# Patient Record
Sex: Female | Born: 1995 | Hispanic: Yes | Marital: Single | Smoking: Never smoker
Health system: Southern US, Community
[De-identification: ages and names within clinical notes are randomized; demographics above are authoritative.]

## PROBLEM LIST (undated history)

## (undated) DIAGNOSIS — Z789 Other specified health status: Secondary | ICD-10-CM

## (undated) DIAGNOSIS — J45909 Unspecified asthma, uncomplicated: Secondary | ICD-10-CM

## (undated) HISTORY — DX: Other specified health status: Z78.9

## (undated) HISTORY — PX: NO PAST SURGERIES: SHX2092

---

## 2016-06-05 NOTE — L&D Delivery Note (Signed)
Delivery Note At 10:49 PM a viable female was delivered via Vaginal, Spontaneous (Presentation: OA).  APGAR: 9, 9; weight pending.   Placenta status: delivered spontaneously, intact.  Cord: 3VC with the following complications: none.  Cord pH: n/a  Anesthesia:  n/a Episiotomy: None Lacerations: None Suture Repair: n/a Est. Blood Loss (mL): 350  Mom to postpartum.  Baby to Couplet care / Skin to Skin.  Called to see patient.  Mom pushed to deliver a viable female infant.  The head followed by shoulders, which delivered without difficulty, and the rest of the body.  No nuchal cord noted.  Baby to mom's chest.  Cord clamped and cut after > 1 min delay.  Cord blood obtained.  Placenta delivered spontaneously, intact, with a 3-vessel cord.  Small right sulcal tear hemostatic without repair.  All counts correct.  Hemostasis obtained with IV pitocin and fundal massage. EBL 350 mL.     Thomasene MohairStephen Janett Kamath, MD 05/06/2017, 11:07 PM

## 2016-12-05 LAB — OB RESULTS CONSOLE GC/CHLAMYDIA
Chlamydia: POSITIVE
GC PROBE AMP, GENITAL: NEGATIVE

## 2016-12-05 LAB — OB RESULTS CONSOLE RUBELLA ANTIBODY, IGM: RUBELLA: IMMUNE

## 2016-12-05 LAB — OB RESULTS CONSOLE HEPATITIS B SURFACE ANTIGEN: Hepatitis B Surface Ag: NEGATIVE

## 2016-12-05 LAB — OB RESULTS CONSOLE HIV ANTIBODY (ROUTINE TESTING): HIV: NONREACTIVE

## 2017-03-15 LAB — OB RESULTS CONSOLE GC/CHLAMYDIA
CHLAMYDIA, DNA PROBE: NEGATIVE
Gonorrhea: NEGATIVE

## 2017-05-01 LAB — OB RESULTS CONSOLE GBS: GBS: NEGATIVE

## 2017-05-06 ENCOUNTER — Inpatient Hospital Stay
Admission: EM | Admit: 2017-05-06 | Discharge: 2017-05-08 | DRG: 807 | Disposition: A | Discharge status: Home / Self Care | Payer: Medicaid - Out of State | Attending: Obstetrics and Gynecology | Admitting: Obstetrics and Gynecology

## 2017-05-06 ENCOUNTER — Encounter: Payer: Self-pay | Admitting: *Deleted

## 2017-05-06 ENCOUNTER — Other Ambulatory Visit: Payer: Self-pay

## 2017-05-06 DIAGNOSIS — Z3A37 37 weeks gestation of pregnancy: Secondary | ICD-10-CM

## 2017-05-06 DIAGNOSIS — Z3483 Encounter for supervision of other normal pregnancy, third trimester: Secondary | ICD-10-CM | POA: Diagnosis present

## 2017-05-06 HISTORY — DX: Unspecified asthma, uncomplicated: J45.909

## 2017-05-06 LAB — CHLAMYDIA/NGC RT PCR (ARMC ONLY)
Chlamydia Tr: NOT DETECTED
N GONORRHOEAE: NOT DETECTED

## 2017-05-06 LAB — CBC
HEMATOCRIT: 39.5 % (ref 35.0–47.0)
Hemoglobin: 13.8 g/dL (ref 12.0–16.0)
MCH: 33 pg (ref 26.0–34.0)
MCHC: 35 g/dL (ref 32.0–36.0)
MCV: 94.1 fL (ref 80.0–100.0)
Platelets: 186 10*3/uL (ref 150–440)
RBC: 4.2 MIL/uL (ref 3.80–5.20)
RDW: 12.6 % (ref 11.5–14.5)
WBC: 15.5 10*3/uL — AB (ref 3.6–11.0)

## 2017-05-06 LAB — TYPE AND SCREEN
ABO/RH(D): O POS
Antibody Screen: NEGATIVE

## 2017-05-06 MED ORDER — OXYTOCIN 40 UNITS IN LACTATED RINGERS INFUSION - SIMPLE MED
1.0000 m[IU]/min | INTRAVENOUS | Status: DC
  Administered 2017-05-06: 1 m[IU]/min via INTRAVENOUS

## 2017-05-06 MED ORDER — LACTATED RINGERS IV SOLN
INTRAVENOUS | Status: DC
  Administered 2017-05-06 (×2): via INTRAVENOUS

## 2017-05-06 MED ORDER — TERBUTALINE SULFATE 1 MG/ML IJ SOLN: 0.2500 mg | Freq: Once | INTRAMUSCULAR | Status: DC | PRN

## 2017-05-06 MED ORDER — OXYTOCIN 40 UNITS IN LACTATED RINGERS INFUSION - SIMPLE MED: 2.5000 [IU]/h | INTRAVENOUS | Status: DC

## 2017-05-06 MED ORDER — LIDOCAINE HCL (PF) 1 % IJ SOLN: 30.0000 mL | INTRAMUSCULAR | Status: DC | PRN

## 2017-05-06 MED ORDER — BUTORPHANOL TARTRATE 1 MG/ML IJ SOLN: 1.0000 mg | INTRAMUSCULAR | Status: DC | PRN

## 2017-05-06 MED ORDER — ACETAMINOPHEN 325 MG PO TABS: 650.0000 mg | ORAL_TABLET | ORAL | Status: DC | PRN

## 2017-05-06 MED ORDER — MISOPROSTOL 200 MCG PO TABS
ORAL_TABLET | ORAL | Status: AC
  Filled 2017-05-06: qty 4

## 2017-05-06 MED ORDER — LACTATED RINGERS IV SOLN: 500.0000 mL | INTRAVENOUS | Status: DC | PRN

## 2017-05-06 MED ORDER — AMMONIA AROMATIC IN INHA
RESPIRATORY_TRACT | Status: AC
  Filled 2017-05-06: qty 10

## 2017-05-06 MED ORDER — OXYTOCIN BOLUS FROM INFUSION
500.0000 mL | Freq: Once | INTRAVENOUS | Status: AC
  Administered 2017-05-06: 500 mL via INTRAVENOUS

## 2017-05-06 MED ORDER — OXYTOCIN 40 UNITS IN LACTATED RINGERS INFUSION - SIMPLE MED
INTRAVENOUS | Status: AC
  Administered 2017-05-06: 1 m[IU]/min via INTRAVENOUS
  Filled 2017-05-06: qty 1000

## 2017-05-06 MED ORDER — OXYTOCIN 10 UNIT/ML IJ SOLN
INTRAMUSCULAR | Status: AC
Start: 2017-05-06 — End: 2017-05-07
  Filled 2017-05-06: qty 2

## 2017-05-06 MED ORDER — ONDANSETRON HCL 4 MG/2ML IJ SOLN: 4.0000 mg | Freq: Four times a day (QID) | INTRAMUSCULAR | Status: DC | PRN

## 2017-05-06 MED ORDER — LIDOCAINE HCL (PF) 1 % IJ SOLN
INTRAMUSCULAR | Status: AC
Start: 2017-05-06 — End: 2017-05-07
  Filled 2017-05-06: qty 30

## 2017-05-06 NOTE — H&P (Signed)
OB History & Physical   History of Present Illness:  Chief Complaint: Contractions every 10 minutes since 11 am this morning. Water may have broken yesterday at 4 pm.  HPI:  Robin Hawkins is a 21 y.o. G1P0 female at 9225w4d dated by LMP agreed with 14 week u/s.  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 .    She reports contractions since 11 am this morning.   She reports a gush of fluid beginning at 4 pm yesterday with continued leaking since then.   She reports vaginal bleeding since this morning when wiping.   She reports fetal movement.    The patient is here visiting family from [place]. Her care provider is [facility]. Patient's first language is [place]. Patient interviews and instruction provided through interpreter.  Maternal Medical History:   Past Medical History:  Diagnosis Date  . Asthma     Past Surgical History:  Procedure Laterality Date  . NO PAST SURGERIES      No Known Allergies  Prior to Admission medications   Medication Sig Start Date End Date Taking? Authorizing Provider  Prenatal Vit-Fe Fumarate-FA (MULTIVITAMIN-PRENATAL) 27-0.8 MG TABS tablet Take 1 tablet by mouth daily at 12 noon.    [provider]    OB History  Gravida Para Term Preterm AB Living  1            SAB TAB Ectopic Multiple Live Births               # Outcome Date GA Lbr Len/2nd Weight Sex Delivery Anes PTL Lv  1 Current               Prenatal care site: [facility]  Social History: She  reports that  has never smoked. she has never used smokeless tobacco. She reports that she does not drink alcohol or use drugs.  Family History: no family history of genetic/birth defects She denies a family  history of gynecologic cancers  Review of Systems: Negative x 10 systems reviewed except as noted in the HPI.    Physical Exam:  Vital Signs: Ht 5\' 6"  (1.676 m)   Wt 154 lb (69.9 kg)   BMI 24.86 kg/m  Constitutional: Well nourished, well developed female in no acute distress.  HEENT: normal Skin: Warm and dry.  Cardiovascular: Regular rate and rhythm.   Extremity: no evidence of DVT  Respiratory: Clear to auscultation bilateral. Normal respiratory effort Abdomen: soft, nontender, nondistended, no abnormal masses, no epigastric pain and FHT present Back: no CVAT Neuro: DTRs 2+, Cranial nerves grossly intact Psych: Alert and Oriented x3. No memory deficits. Normal mood and affect.  MS: normal gait, normal bilateral lower extremity ROM/strength/stability.  Pelvic exam:  is not limited by body habitus EGBUS: within normal limits Vagina: within normal limits and with normal mucosa, watery blood pooling at the cervix by sterile speculum exam  Cervix: 1/70/-2 per RN exam Nitrazine equivocal Pooling + Ferning: only red blood cells seen   Pertinent Results:  Prenatal Labs: Blood type/Rh O positive  Antibody screen negative  Rubella Immune  Varicella Unknown    RPR Non-reactive  HBsAg negative  HIV negative  GC negative  Chlamydia Positive 7/3, Negative 10/11  Genetic screening MSAFP neg  1 hour  GTT 119  3 hour GTT NA  GBS negative on 11/27   Baseline FHR: 140 beats/min   Variability: moderate   Accelerations: 10x10 present   Decelerations: variable present Contractions: present frequency: every 2-3 min Overall assessment: Category II but overall reassuring  No ultrasound report available but records state ultrasound was normal  Assessment:  Robin Hawkins is a 21 y.o. G1P0 female at 2852w4d with probable rupture of membranes, frequent contractions.   Plan:  1. Admit to Labor & Delivery  2. CBC, T&S, Clrs, IVF 3. GBS negative.   4. Fetal well-being: overall  reassuring 5. Augment labor with pitocin 6. [facility]  Tresea MallJane Dhani Imel, [place] 05/06/2017 1:37 PM

## 2017-05-06 NOTE — Plan of Care (Signed)
Discussed plan of care with patient. Patient verbalized understanding.

## 2017-05-06 NOTE — Progress Notes (Signed)
Robin LungLiz Chavez, volunteer doula, at bedside to assist patient with laboring process.

## 2017-05-06 NOTE — Progress Notes (Signed)
Difficulty accurately assessing contractions while patient sitting up on birthing ball, unable to accurately correlate decelerations with ctxs. Toco adjusted. RN remains at bedside.

## 2017-05-06 NOTE — Discharge Summary (Signed)
OB Discharge Summary     Patient Name: Robin Hawkins DOB: 1996/03/07 MRN: [id number]  Date of admission: 05/06/2017 Delivering MD: Thomasene MohairStephen Jackson, MD  Date of Delivery: 05/06/2017  Date of discharge: 05/08/2017  Admitting diagnosis: 40 weeks contractions Intrauterine pregnancy: 7263w4d     Secondary diagnosis: None     Discharge diagnosis: Term Pregnancy Delivered                                                                                                Post partum procedures:None  Augmentation: Pitocin  Complications: None  Hospital course:  Onset of Labor With Vaginal Delivery     21 y.o. yo G1P0 at 663w4d was admitted in Active Labor on 05/06/2017. Patient had an uncomplicated labor course as follows:  Membrane Rupture Time/Date: 4:00 PM ,05/05/2017   Intrapartum Procedures: Episiotomy: None [1]                                         Lacerations:  None [1]  Patient had a delivery of a Viable infant. 05/06/2017  Information for the patient's newborn:  Robin Hawkins, Boy Karsten [[id number]][[id number]]  Delivery Method: Vag-Spont    Pateint had an uncomplicated postpartum course.  She is ambulating, tolerating a regular diet, passing flatus, and urinating well. Patient is discharged home in stable condition on 05/08/17.   Physical exam  Vitals:   05/07/17 1618 05/07/17 2021 05/07/17 2324 05/08/17 0736  BP: 110/64 106/73 105/63 (!) 101/59  Pulse: 85 74 78 78  Resp:  18 18 16   Temp: 97.9 F (36.6 C) 98.3 F (36.8 C) 98.1 F (36.7 C) (!) 97.5 F (36.4 C)  TempSrc: Oral Oral Oral Oral  SpO2: 100% 99% 99% 99%  Weight:      Height:       General: alert, cooperative and no distress Lochia: appropriate Uterine Fundus: firm Incision: N/A DVT Evaluation: No evidence of DVT seen on physical exam. No significant calf/ankle edema.  Labs: Lab Results  Component Value Date   WBC 21.9 (H) 05/07/2017   HGB 12.5 05/07/2017   HCT 36.3 05/07/2017   MCV 94.2 05/07/2017   PLT 155  05/07/2017    Discharge instruction: per After Visit Summary.  Medications:  Allergies as of 05/08/2017   No Known Allergies     Medication List    TAKE these medications   ibuprofen 600 MG tablet Commonly known as:  ADVIL,MOTRIN Take 1 tablet (600 mg total) by mouth every 6 (six) hours.   multivitamin-prenatal 27-0.8 MG Tabs tablet Take 1 tablet by mouth daily at 12 noon.       Diet: routine diet  Activity: Advance as tolerated. Pelvic rest for 6 weeks.   Outpatient follow up: Follow-up Information    Conard NovakJackson, Stephen D, MD Follow up in 6 week(s).   Specialty:  Obstetrics and Gynecology Why:  6 week postpartum visit or home OB/GYN Contact information: [address] [place] [id number] [phone]  Postpartum contraception: Undecided Rhogam Given postpartum: no Rubella vaccine given postpartum: no Varicella vaccine given postpartum: no TDaP given antepartum or postpartum: unknown  Newborn Data: Live born female  Birth Weight:  2860 grams APGAR: 9, 9  Newborn Delivery   Birth date/time:  05/06/2017 22:49:00 Delivery type:  Vaginal, Spontaneous    Baby Feeding: Breast  Disposition:home with mother  SIGNED: Adelene Idlerhristanna Schuman MD

## 2017-05-06 NOTE — OB Triage Note (Deleted)
Patient came in for observation for preterm labor evaluation. Patient reports contractions every four minutes that started today at 1700. Patient rates pain 6/10. Patient denies leaking of fluid, denies vaginal bleeding and spotting. Patient reports + FM at this time. Vital signs stable and patient afebrile. FHR baseline 115 with moderate variability with accelerations 15 x 15 and no decelerations. Husband at bedside. Will continue to monitor.    

## 2017-05-07 DIAGNOSIS — Z3A37 37 weeks gestation of pregnancy: Secondary | ICD-10-CM

## 2017-05-07 LAB — CBC
HEMATOCRIT: 36.3 % (ref 35.0–47.0)
Hemoglobin: 12.5 g/dL (ref 12.0–16.0)
MCH: 32.3 pg (ref 26.0–34.0)
MCHC: 34.3 g/dL (ref 32.0–36.0)
MCV: 94.2 fL (ref 80.0–100.0)
Platelets: 155 10*3/uL (ref 150–440)
RBC: 3.85 MIL/uL (ref 3.80–5.20)
RDW: 12.4 % (ref 11.5–14.5)
WBC: 21.9 10*3/uL — AB (ref 3.6–11.0)

## 2017-05-07 MED ORDER — SENNOSIDES-DOCUSATE SODIUM 8.6-50 MG PO TABS
2.0000 | ORAL_TABLET | ORAL | Status: DC
  Administered 2017-05-07 (×2): 2 via ORAL
  Filled 2017-05-07 (×2): qty 2

## 2017-05-07 MED ORDER — ONDANSETRON HCL 4 MG/2ML IJ SOLN: 4.0000 mg | INTRAMUSCULAR | Status: DC | PRN

## 2017-05-07 MED ORDER — COCONUT OIL OIL
1.0000 "application " | TOPICAL_OIL | Status: DC | PRN
  Administered 2017-05-07: 1 via TOPICAL
  Filled 2017-05-07: qty 120

## 2017-05-07 MED ORDER — IBUPROFEN 600 MG PO TABS
600.0000 mg | ORAL_TABLET | Freq: Four times a day (QID) | ORAL | Status: DC
  Administered 2017-05-07 – 2017-05-08 (×6): 600 mg via ORAL
  Filled 2017-05-07 (×6): qty 1

## 2017-05-07 MED ORDER — DIPHENHYDRAMINE HCL 25 MG PO CAPS: 25.0000 mg | ORAL_CAPSULE | Freq: Four times a day (QID) | ORAL | Status: DC | PRN

## 2017-05-07 MED ORDER — BENZOCAINE-MENTHOL 20-0.5 % EX AERO
1.0000 "application " | INHALATION_SPRAY | CUTANEOUS | Status: DC | PRN
  Administered 2017-05-07: 1 via TOPICAL
  Filled 2017-05-07: qty 56

## 2017-05-07 MED ORDER — WITCH HAZEL-GLYCERIN EX PADS: 1.0000 "application " | MEDICATED_PAD | CUTANEOUS | Status: DC | PRN

## 2017-05-07 MED ORDER — ACETAMINOPHEN 325 MG PO TABS: 650.0000 mg | ORAL_TABLET | ORAL | Status: DC | PRN

## 2017-05-07 MED ORDER — FERROUS SULFATE 325 (65 FE) MG PO TABS
325.0000 mg | ORAL_TABLET | Freq: Two times a day (BID) | ORAL | Status: DC
  Administered 2017-05-07 – 2017-05-08 (×3): 325 mg via ORAL
  Filled 2017-05-07 (×3): qty 1

## 2017-05-07 MED ORDER — DIBUCAINE 1 % RE OINT: 1.0000 "application " | TOPICAL_OINTMENT | RECTAL | Status: DC | PRN

## 2017-05-07 MED ORDER — ONDANSETRON HCL 4 MG PO TABS: 4.0000 mg | ORAL_TABLET | ORAL | Status: DC | PRN

## 2017-05-07 MED ORDER — HYDROCODONE-ACETAMINOPHEN 5-325 MG PO TABS: 1.0000 | ORAL_TABLET | Freq: Four times a day (QID) | ORAL | Status: DC | PRN

## 2017-05-07 MED ORDER — SIMETHICONE 80 MG PO CHEW: 80.0000 mg | CHEWABLE_TABLET | ORAL | Status: DC | PRN

## 2017-05-07 MED ORDER — PRENATAL MULTIVITAMIN CH
1.0000 | ORAL_TABLET | Freq: Every day | ORAL | Status: DC
  Administered 2017-05-07 – 2017-05-08 (×2): 1 via ORAL
  Filled 2017-05-07 (×2): qty 1

## 2017-05-07 NOTE — Progress Notes (Signed)
PPD #1 SVD Subjective:  Supine in bed, well-appearing. Pain control is good with scheduled medications. Voiding without difficulty. Tolerating a regular diet. Ambulating well.  Objective:   Blood pressure 105/60, pulse 74, temperature 98.6 F (37 C), temperature source Oral, resp. rate 18, height 5\' 6"  (1.676 m), weight 154 lb (69.9 kg), last menstrual period 08/16/2016, SpO2 98 %, currently breastfeeding.  General: NAD Pulmonary: no increased work of breathing Abdomen: non-distended, non-tender Uterus:  fundus firm at U/2; lochia rubra small Laceration: None Extremities: no edema, no erythema, no tenderness, no signs of DVT  Results for orders placed or performed during the hospital encounter of 05/06/17 (from the past 72 hour(s))  CBC     Status: Abnormal   Collection Time: 05/06/17  1:11 PM  Result Value Ref Range   WBC 15.5 (H) 3.6 - 11.0 K/uL   RBC 4.20 3.80 - 5.20 MIL/uL   Hemoglobin 13.8 12.0 - 16.0 g/dL   HCT 16.139.5 09.635.0 - 04.547.0 %   MCV 94.1 80.0 - 100.0 fL   MCH 33.0 26.0 - 34.0 pg   MCHC 35.0 32.0 - 36.0 g/dL   RDW 40.912.6 81.111.5 - 91.414.5 %   Platelets 186 150 - 440 K/uL  Type and screen [facility]     Status: None   Collection Time: 05/06/17  1:11 PM  Result Value Ref Range   ABO/RH(D) O POS    Antibody Screen NEG    Sample Expiration 05/09/2017   Chlamydia/NGC rt PCR ([facility] only)     Status: None   Collection Time: 05/06/17  1:12 PM  Result Value Ref Range   Specimen source GC/Chlam URINE, RANDOM    Chlamydia Tr NOT DETECTED NOT DETECTED   N gonorrhoeae NOT DETECTED NOT DETECTED    Comment: (NOTE) 100  This methodology has not been evaluated in pregnant women or in 200  patients with a history of hysterectomy. 300 400  This methodology will not be performed on patients less than 9814  years of age.   CBC     Status: Abnormal   Collection Time: 05/07/17  5:46 AM  Result Value Ref Range   WBC 21.9 (H) 3.6 - 11.0 K/uL   RBC 3.85 3.80 - 5.20 MIL/uL    Hemoglobin 12.5 12.0 - 16.0 g/dL   HCT 78.236.3 95.635.0 - 21.347.0 %   MCV 94.2 80.0 - 100.0 fL   MCH 32.3 26.0 - 34.0 pg   MCHC 34.3 32.0 - 36.0 g/dL   RDW 08.612.4 57.811.5 - 46.914.5 %   Platelets 155 150 - 440 K/uL    Assessment:   21 y.o. G1P1001 postpartum day # 1 following SVD, in good condition.  Plan:    1) Blood Type --/--/O POS (12/02 1311)   2) Rubella Immune (07/03 0000) / Varicella unknown, titer ordered / TDAP status, unknown, offer vaccine before discharge.  4) Breast and formula feeding  5) Contraception - undecided  6) Disposition - continue normal postpartum care, discharge home tomorrow.  Marcelyn BruinsJacelyn Tyrese Ficek, CNM 05/07/2017  9:31 AM

## 2017-05-07 NOTE — Lactation Note (Addendum)
This note was copied from a baby's chart. Lactation Consultation Note  Patient Name: Robin Hawkins Today's Date: 05/07/2017 Reason for consult: Initial assessment;Primapara Cindi CarbonJacquie, spanish interpreter present  Maternal Data Formula Feeding for Exclusion: No Has patient been taught Hand Expression?: Yes Does the patient have breastfeeding experience prior to this delivery?: No  Feeding Feeding Type: Breast Fed Length of feed: 20 min(10 min . left, 10 min. right sidelying) Sleepy baby LATCH Score                   Interventions    Lactation Tools Discussed/Used WIC Program: [place] Has access to sister's breastpump   Consult Status      Dyann KiefMarsha D Alvine Mostafa 05/07/2017, 3:01 PM

## 2017-05-08 LAB — VARICELLA ZOSTER ANTIBODY, IGG: VARICELLA IGG: 215 {index} (ref >165)

## 2017-05-08 LAB — SYPHILIS: RPR W/REFLEX TO RPR TITER AND TREPONEMAL ANTIBODIES, TRADITIONAL SCREENING AND DIAGNOSIS ALGORITHM: RPR Ser Ql: NONREACTIVE

## 2017-05-08 MED ORDER — IBUPROFEN 600 MG PO TABS: 600.0000 mg | ORAL_TABLET | Freq: Four times a day (QID) | ORAL | 0 refills | Status: DC

## 2017-05-08 NOTE — Progress Notes (Signed)
Pt discharged with infant.  Discharge instructions, prescriptions and follow up appointment given to and reviewed with pt. Pt verbalized understanding. Escorted out by auxillary. 

## 2017-05-08 NOTE — Progress Notes (Signed)
Patient ID: Robin Hawkins, female   DOB: [birth year], 21 y.o.   MRN: [id number]  Admit Date: 05/06/2017 Today's Date: 05/08/2017  Post Partum Day 2  Subjective:  No complaints, up ad lib, voiding, tolerating PO, + flatus and +bowel movement  Objective: Temp:  [97.5 F (36.4 C)-98.6 F (37 C)] 97.5 F (36.4 C) (12/04 0736) Pulse Rate:  [74-85] 78 (12/04 0736) Resp:  [16-18] 16 (12/04 0736) BP: (101-112)/(59-73) 101/59 (12/04 0736) SpO2:  [99 %-100 %] 99 % (12/04 0736)  Physical Exam:  General: alert, cooperative, appears stated age and no distress Lochia: appropriate Uterine Fundus: firm Incision: none DVT Evaluation: No evidence of DVT seen on physical exam. No significant calf/ankle edema.  Recent Labs    05/06/17 1311 05/07/17 0546  HGB 13.8 12.5  HCT 39.5 36.3    Assessment/Plan: Discharge home, Breastfeeding and Infant doing well  Varicella and Rubella Immune   LOS: 2 days   Robin Hawkins [facility] 05/08/2017, 8:18 AM

## 2020-01-04 DIAGNOSIS — R87629 Unspecified abnormal cytological findings in specimens from vagina: Secondary | ICD-10-CM

## 2020-01-04 HISTORY — DX: Unspecified abnormal cytological findings in specimens from vagina: R87.629

## 2020-01-26 ENCOUNTER — Ambulatory Visit (LOCAL_COMMUNITY_HEALTH_CENTER): Payer: Self-pay | Admitting: Physician Assistant

## 2020-01-26 ENCOUNTER — Encounter: Payer: Self-pay | Admitting: Physician Assistant

## 2020-01-26 VITALS — BP 110/69 | Ht 63.0 in | Wt 122.0 lb

## 2020-01-26 DIAGNOSIS — Z3009 Encounter for other general counseling and advice on contraception: Secondary | ICD-10-CM

## 2020-01-26 DIAGNOSIS — Z113 Encounter for screening for infections with a predominantly sexual mode of transmission: Secondary | ICD-10-CM

## 2020-01-26 DIAGNOSIS — Z01419 Encounter for gynecological examination (general) (routine) without abnormal findings: Secondary | ICD-10-CM

## 2020-01-26 NOTE — Progress Notes (Signed)
Pt plans abstinence until after nexplanon placed. Nexplanon consult completed. Provider orders completed.

## 2020-01-26 NOTE — Progress Notes (Signed)
[facility] DEPARTMENT [facility] [address] Main Number: [phone]    Family Planning Visit- Initial Visit  Subjective:  Robin Hawkins is a 24 y.o.  G1P1001   being seen today for an initial well woman visit and to discuss family planning options.  She is currently using None for pregnancy prevention. Patient reports she does not want a pregnancy in the next year.  Patient has the following medical conditions has Labor and delivery, indication for care on their problem list.  Chief Complaint  Patient presents with  . Contraception    PE and discuss BCM.    Patient reports that she is interested in getting the Nexplanon for [facility].  Reports that she previously used Depo and OCs.    Patient denies any concerns today.    Body mass index is 21.61 kg/m. - Patient is eligible for diabetes screening based on BMI and age >33?  not applicable HA1C ordered? not applicable  Patient reports 1 of partners in last year. Desires STI screening?  Yes  Has patient been screened once for HCV in the past?  No  No results found for: HCVAB  Does the patient have current drug use (including MJ), have a partner with drug use, and/or has been incarcerated since last result? No  If yes-- Screen for HCV through [facility]   Does the patient meet criteria for HBV testing? No  Criteria:  -Household, sexual or needle sharing contact with HBV -History of drug use -HIV positive -Those with known Hep C   Health Maintenance Due  Topic Date Due  . Hepatitis C Screening  Never done  . COVID-19 Vaccine (1) Never done  . CHLAMYDIA SCREENING  Never done  . TETANUS/TDAP  Never done  . PAP-Cervical Cytology Screening  Never done  . PAP SMEAR-Modifier  Never done  . INFLUENZA VACCINE  01/04/2020    Review of Systems  All other systems reviewed and are negative.   The following portions of the patient's history were reviewed and updated as appropriate:  allergies, current medications, past family history, past medical history, past social history, past surgical history and problem list. Problem list updated.   See flowsheet for other program required questions.  Objective:   Vitals:   01/26/20 1049  BP: 110/69  Weight: 122 lb (55.3 kg)  Height: 5\' 3"  (1.6 m)    Physical Exam Vitals and nursing note reviewed.  Constitutional:      General: She is not in acute distress.    Appearance: Normal appearance.  HENT:     Head: Normocephalic and atraumatic.  Eyes:     Conjunctiva/sclera: Conjunctivae normal.  Neck:     Thyroid: No thyroid mass, thyromegaly or thyroid tenderness.  Cardiovascular:     Rate and Rhythm: Normal rate and regular rhythm.  Pulmonary:     Effort: Pulmonary effort is normal.     Breath sounds: Normal breath sounds.  Chest:     Breasts:        Right: Normal. No mass, nipple discharge, skin change or tenderness.        Left: Normal. No mass, nipple discharge, skin change or tenderness.  Abdominal:     Palpations: Abdomen is soft. There is no mass.     Tenderness: There is no abdominal tenderness. There is no guarding or rebound.  Genitourinary:    General: Normal vulva.     Rectum: Normal.     Comments: External genitalia/pubic  area without nits, lice, edema, erythema, lesions and inguinal adenopathy. Vagina with normal mucosa and small amount of pinkish/bloody  discharge. Cervix without visible lesions. Uterus firm, mobile, nt, no masses, no CMT, no adnexal tenderness or fullness. Musculoskeletal:     Cervical back: Neck supple. No tenderness.  Lymphadenopathy:     Cervical: No cervical adenopathy.     Upper Body:     Right upper body: No supraclavicular, axillary or pectoral adenopathy.     Left upper body: No supraclavicular, axillary or pectoral adenopathy.  Skin:    General: Skin is warm and dry.     Findings: No bruising, erythema, lesion or rash.  Neurological:     Mental Status: She is alert  and oriented to person, place, and time.  Psychiatric:        Mood and Affect: Mood normal.        Behavior: Behavior normal.        Thought Content: Thought content normal.        Judgment: Judgment normal.       Assessment and Plan:  Robin Hawkins is a 24 y.o. female presenting to the [facility] Department for an initial well woman exam/family planning visit  Contraception counseling: Reviewed all forms of birth control options in the tiered based approach. available including abstinence; over the counter/barrier methods; hormonal contraceptive medication including pill, patch, ring, injection,contraceptive implant, ECP; hormonal and nonhormonal IUDs; permanent sterilization options including vasectomy and the various tubal sterilization modalities. Risks, benefits, and typical effectiveness rates were reviewed.  Questions were answered.  Written information was also given to the patient to review.  Patient desires Nexplanon. She will follow up in  3 days for insertion, 1 year for RP and prn for surveillance.  She was told to call with any further questions, or with any concerns about this method of contraception.  Emphasized use of condoms 100% of the time for STI prevention.  Patient was not a candidate for ECP today.   1. Encounter for counseling regarding contraception Counseled as above re:  BCMs and specifically about Nexplanon. Offered patient option to stay today for insertion but patient declines and to be scheduled for insertion on another day. Rec abstinence until after insertion and condoms as back up for 10 days after insertion. Enc condoms always for STD protection.  2. Well woman exam with routine gynecological exam Reviewed healthy habits to maintain general health. Enc MVI 1 po daily. Counseled that she should receive a call or letter about pap results once they are back. Enc to establish with/ follow up with PCP for primary care concerns and  illness. - Pap IG (Image Guided)  3. Screening for STD (sexually transmitted disease) Await test results.  Counseled that RN will call if needs to RTC for treatment once results are back.  - Chlamydia/Gonorrhea [facility]     Return in about 3 days (around 01/29/2020) for nexplanon insertion.  Future Appointments  Date Time Provider Department Center  01/29/2020 11:00 AM [facility] PROVIDER [facility] None    Matt Holmes, PA

## 2020-01-26 NOTE — Progress Notes (Signed)
Pt scheduled for physical. Pt is interested in Nexplanon. Last depo injection was in 2017.

## 2020-01-28 LAB — PAP IG (IMAGE GUIDED): PAP Smear Comment: 0

## 2020-01-29 ENCOUNTER — Ambulatory Visit: Payer: Self-pay

## 2020-02-03 ENCOUNTER — Encounter: Payer: Self-pay | Admitting: Physician Assistant

## 2020-02-03 ENCOUNTER — Ambulatory Visit (LOCAL_COMMUNITY_HEALTH_CENTER): Payer: Medicaid - Out of State | Admitting: Physician Assistant

## 2020-02-03 ENCOUNTER — Other Ambulatory Visit: Payer: Self-pay

## 2020-02-03 VITALS — BP 118/80 | Ht 63.0 in | Wt 124.0 lb

## 2020-02-03 DIAGNOSIS — Z3009 Encounter for other general counseling and advice on contraception: Secondary | ICD-10-CM

## 2020-02-03 DIAGNOSIS — Z3201 Encounter for pregnancy test, result positive: Secondary | ICD-10-CM

## 2020-02-03 LAB — PREGNANCY, URINE: Preg Test, Ur: POSITIVE — AB

## 2020-02-03 MED ORDER — PRENATAL VITAMINS 28-0.8 MG PO TABS: 1.0000 | ORAL_TABLET | Freq: Every day | ORAL | 0 refills | Status: DC

## 2020-02-03 NOTE — Progress Notes (Addendum)
UPT is positive today. Pt received proof of pregnancy, PNV's, positive PT packet and resource list per provider orders. Counseled pt per provider orders and pt states understanding. Pt unsure of plans for pregnancy at this time and unsure of where she would like to go for prenatal care. Provider orders completed.

## 2020-02-03 NOTE — Progress Notes (Signed)
Pt is here for Nexplanon insertion. Pt reports last sex was ~8/16th or 8/17th with condom. RN counseling for Nexplanon insertion completed and consent forms reviewed and signed by pt.

## 2020-02-03 NOTE — Progress Notes (Signed)
Patient into clinic for Nexplanon insertion. Patient had been seen 01/26/2020 for IP and rescheduled today for Nexplanon insertion.  Pregnancy test done prior to insertion was positive.  Patient denies problems during her previous pregnancy and any symptoms of early pregnancy.  Patient states last period was 01/21/2020 and normal.  States previous period was 12/24/2019 and also norma.  Proof of pregnancy form completed using both dates and will be given to patient.  Resource list and PNV to be given patient as well.  Enc patient to RTC for Nexplanon insertion if in the future she still desires to use this as her BCM.

## 2020-02-05 ENCOUNTER — Encounter: Payer: Self-pay | Admitting: Physician Assistant

## 2020-03-01 ENCOUNTER — Telehealth: Payer: Self-pay

## 2020-03-01 NOTE — Telephone Encounter (Signed)
TC to patient to f/u on +PT. Patient plans care at [facility] and is scheduled for Monday 03/08/20. Patient aware to arrive 0800. Patient is about 11 weeks at this time.Burt Knack, RN

## 2020-03-05 ENCOUNTER — Telehealth: Payer: Self-pay

## 2020-03-05 NOTE — Telephone Encounter (Signed)
TC to patient to reschedule new OB due to gestational age of [redacted] weeks. Patient reports negative pregnancy yesterday at home and states she went on a trip to [place] and bled for 1 week while away. Bleeding began on 02/19/20 at lasted about 1 week per patient. Patient states she did not seek any medical care at that time. Per consult with provider, Maximiano Coss, PA , patient scheduled for Beta Hcg lab test on Monday 03/08/20, and new OB appointment cancelled. Patient states understanding of situation.Burt Knack, RN

## 2020-03-08 ENCOUNTER — Other Ambulatory Visit: Payer: Self-pay

## 2020-03-10 ENCOUNTER — Telehealth: Payer: Self-pay

## 2020-03-10 NOTE — Telephone Encounter (Signed)
TC to patient to reschedule missed lab appointment for Beta Hcg to follow-up on apparent miscarriage. Patient missed appointment on 03/08/20. VM full, unable to LM.Marland KitchenBurt Knack, RN

## 2020-03-16 ENCOUNTER — Telehealth: Payer: Self-pay

## 2020-03-16 NOTE — Telephone Encounter (Signed)
Call to client to reschedule missed appt in [facility] for beta hcg. Appt scheduled for 03/19/2020. Jossie Ng, RN

## 2020-03-19 ENCOUNTER — Other Ambulatory Visit: Payer: Self-pay

## 2020-03-22 ENCOUNTER — Other Ambulatory Visit: Payer: Self-pay

## 2020-03-22 DIAGNOSIS — O200 Threatened abortion: Secondary | ICD-10-CM

## 2020-03-22 NOTE — Progress Notes (Signed)
Pt here today for Beta HCG. Pt sent to lab. Juliene Pina, interpreter today. Jerel Shepherd, RN

## 2020-03-23 LAB — BETA HCG QUANT (REF LAB): hCG Quant: <1 m[IU]/mL

## 2020-03-24 ENCOUNTER — Telehealth: Payer: Self-pay

## 2020-03-24 NOTE — Telephone Encounter (Signed)
Client notified of beta hcg result (< 1). Client desires Depo for birth control. As physical with Beatris Si PA 01/26/2020, acute appt scheduled for tomorrow to discuss [facility] with provider. Client aware to arrive at 1:15 pm. Jossie Ng, RN

## 2020-03-25 ENCOUNTER — Ambulatory Visit (LOCAL_COMMUNITY_HEALTH_CENTER): Payer: Self-pay | Admitting: Physician Assistant

## 2020-03-25 ENCOUNTER — Other Ambulatory Visit: Payer: Self-pay

## 2020-03-25 VITALS — BP 117/73 | Ht 62.75 in | Wt 126.8 lb

## 2020-03-25 DIAGNOSIS — Z3009 Encounter for other general counseling and advice on contraception: Secondary | ICD-10-CM

## 2020-03-25 DIAGNOSIS — Z30013 Encounter for initial prescription of injectable contraceptive: Secondary | ICD-10-CM

## 2020-03-25 MED ORDER — MEDROXYPROGESTERONE ACETATE 150 MG/ML IM SUSP
150.0000 mg | INTRAMUSCULAR | Status: DC
  Administered 2020-03-25 – 2020-08-06 (×2): 150 mg via INTRAMUSCULAR

## 2020-03-25 NOTE — Progress Notes (Signed)
Family Planning Visit- Repeat Yearly Visit  Subjective:  Robin Hawkins is a 24 y.o. G2P1001  being seen today for an well woman visit and to discuss family planning options.    She is currently using None for pregnancy prevention. Patient reports she does not want a pregnancy in the next year. Patient  has Labor and delivery, indication for care on their problem list.  Chief Complaint  Patient presents with  . Contraception    Patient reports recent SAB, last sex 03/18/20 without a condom, period started today.  Patient denies any concerns or problems with DMPA, which she used in the past.   See flowsheet for other program required questions.   Body mass index is 22.64 kg/m. - Patient is eligible for diabetes screening based on BMI and age >81?  no HA1C ordered? not applicable  Patient reports 1 of partners in last year. Desires STI screening?  No - declines   Has patient been screened once for HCV in the past?  No  No results found for: HCVAB  Does the patient have current of drug use, have a partner with drug use, and/or has been incarcerated since last result? No  If yes-- Screen for HCV through [facility]   Does the patient meet criteria for HBV testing? No  Criteria:  -Household, sexual or needle sharing contact with HBV -History of drug use -HIV positive -Those with known Hep C   Health Maintenance Due  Topic Date Due  . Hepatitis C Screening  Never done  . COVID-19 Vaccine (1) Never done  . CHLAMYDIA SCREENING  Never done  . TETANUS/TDAP  Never done  . INFLUENZA VACCINE  Never done    Review of Systems  All other systems reviewed and are negative.   The following portions of the patient's history were reviewed and updated as appropriate: allergies, current medications, past family history, past medical history, past social history, past surgical history and problem list. Problem list updated.  Objective:   Vitals:   03/25/20 1325  BP: 117/73   Weight: 126 lb 12.8 oz (57.5 kg)  Height: 5' 2.75" (1.594 m)    Physical Exam Vitals and nursing note reviewed.  Constitutional:      Appearance: Normal appearance. She is normal weight.  Neurological:     Mental Status: She is alert.  Psychiatric:        Mood and Affect: Mood normal.        Behavior: Behavior normal.        Thought Content: Thought content normal.        Judgment: Judgment normal.       Assessment and Plan:  Robin Hawkins is a 24 y.o. female G2P1001 presenting to the [facility] Department for family planning visit  Contraception counseling: Reviewed all forms of birth control options in the tiered based approach. available including abstinence; over the counter/barrier methods; hormonal contraceptive medication including pill, patch, ring, injection,contraceptive implant, ECP; hormonal and nonhormonal IUDs; permanent sterilization options including vasectomy and the various tubal sterilization modalities. Risks, benefits, and typical effectiveness rates were reviewed.  Questions were answered.  Written information was also given to the patient to review.  Patient desires DMPA, this was prescribed for patient. She will follow up in  3 mo for surveillance.  She was told to call with any further questions, or with any concerns about this method of contraception.  Emphasized use of condoms 100% of the time for STI prevention.  Patient was offered ECP. ECP was not accepted by the patient. ECP counseling was given - see RN documentation    1. Family planning services Reviewed quant BhCG result <1 from 03/22/20, pt currently menstruating. DMPA 150 mg IM today, repeat every 3 mo for 1 year. Reinforced consistent condom use if sex in the next week. Annual well-woman exam due 02/2021. - medroxyPROGESTERone (DEPO-PROVERA) injection 150 mg     Return in about 3 months (around 06/25/2020) for Routine DMPA injection.  No future appointments.  Landry Dyke, PA-C

## 2020-03-25 NOTE — Progress Notes (Signed)
Depo given right deltoid, tolerated well, next Depo card given, condoms given. Depo consent signed.Burt Knack, RN

## 2020-03-25 NOTE — Progress Notes (Signed)
Patient here to start [facility], wants to use Depo. Recent miscarriage. Last Beta Hcg was <1, on 03/22/20. Had PE 01/26/2020.Marland KitchenBurt Knack, RN

## 2020-07-19 ENCOUNTER — Ambulatory Visit: Payer: Self-pay

## 2020-08-06 ENCOUNTER — Other Ambulatory Visit: Payer: Self-pay

## 2020-08-06 ENCOUNTER — Encounter: Payer: Self-pay | Admitting: Advanced Practice Midwife

## 2020-08-06 ENCOUNTER — Ambulatory Visit (LOCAL_COMMUNITY_HEALTH_CENTER): Payer: Self-pay | Admitting: Advanced Practice Midwife

## 2020-08-06 ENCOUNTER — Ambulatory Visit: Payer: Self-pay

## 2020-08-06 VITALS — BP 112/69 | Ht 62.75 in | Wt 131.6 lb

## 2020-08-06 DIAGNOSIS — Z3009 Encounter for other general counseling and advice on contraception: Secondary | ICD-10-CM

## 2020-08-06 DIAGNOSIS — Z30013 Encounter for initial prescription of injectable contraceptive: Secondary | ICD-10-CM

## 2020-08-06 LAB — PREGNANCY, URINE: Preg Test, Ur: NEGATIVE

## 2020-08-06 MED ORDER — MEDROXYPROGESTERONE ACETATE 150 MG/ML IM SUSP: 150.0000 mg | Freq: Once | INTRAMUSCULAR | Status: DC

## 2020-08-06 NOTE — Progress Notes (Signed)
Folic acid counseling completed and encouraged to purchase MVI with folic acid and take per bottle directions. Jossie Ng, RN  Per Hazle Coca CNM, may continue Depo as ordered [phone]. Client tolerated Depo without complaint. Jossie Ng, RN

## 2020-08-06 NOTE — Progress Notes (Signed)
Contraception/Family Planning VISIT ENCOUNTER NOTE  Subjective:   Robin Hawkins is a 25 y.o. SHF G77P1011(3 yo son) nonsmoker female here for reproductive life counseling.  Desires DMPA for BCM.  Reports she does not want a pregnancy in the next year. Denies abnormal vaginal bleeding, discharge, pelvic pain, problems with intercourse or other gynecologic concerns.  Last DMPA 03/25/20.  Last physical 01/26/20.  Last pap 01/06/20.  Last sex 08/02/20 with condom; with current partner x 3 mo; 1 sex partner in last 3 mo.  LMP 07/30/20.  Denies cigs, vaping, cigars, MJ   Gynecologic History Patient's last menstrual period was 07/30/2020 (exact date). Contraception: none  Health Maintenance Due  Topic Date Due  . Hepatitis C Screening  Never done  . COVID-19 Vaccine (1) Never done  . HPV VACCINES (1 - 2-dose series) Never done  . TETANUS/TDAP  Never done  . CHLAMYDIA SCREENING  05/06/2018  . INFLUENZA VACCINE  Never done     The following portions of the patient's history were reviewed and updated as appropriate: allergies, current medications, past family history, past medical history, past social history, past surgical history and problem list.  Review of Systems Pertinent items are noted in HPI.   Objective:  BP 112/69   Ht 5' 2.75" (1.594 m)   Wt 131 lb 9.6 oz (59.7 kg)   LMP 07/30/2020 (Exact Date)   Breastfeeding No   BMI 23.50 kg/m  Gen: well appearing, NAD HEENT: no scleral icterus CV: RR Lung: Normal WOB Ext: warm well perfused     Assessment and Plan:   Contraception counseling: Reviewed all forms of birth control options in the tiered based approach. available including abstinence; over the counter/barrier methods; hormonal contraceptive medication including pill, patch, ring, injection,contraceptive implant, ECP; hormonal and nonhormonal IUDs; permanent sterilization options including vasectomy and the various tubal sterilization modalities. Risks, benefits, and  typical effectiveness rates were reviewed.  Questions were answered.  Written information was also given to the patient to review.  Patient desires DMPA, this was prescribed for patient. She will follow up in 11-13 wks for surveillance.  She was told to call with any further questions, or with any concerns about this method of contraception.  Emphasized use of condoms 100% of the time for STI prevention.  Patient was offered ECP. ECP was not accepted by the patient. ECP counseling was not given - see RN documentation  1. Family planning If PT neg today may have DMPA 150 mg IM until 01/2021 when needs yearly physical. - Pregnancy, urine  2. Encounter for initial prescription of injectable contraceptive See above - medroxyPROGESTERone (DEPO-PROVERA) injection 150 mg    Please refer to After Visit Summary for other counseling recommendations.   Return in 11 weeks (on 10/22/2020) for Depo.  Alberteen Spindle, CNM [facility] DEPARTMENT

## 2021-01-06 ENCOUNTER — Ambulatory Visit (LOCAL_COMMUNITY_HEALTH_CENTER): Payer: Self-pay | Admitting: Physician Assistant

## 2021-01-06 ENCOUNTER — Other Ambulatory Visit: Payer: Self-pay

## 2021-01-06 VITALS — BP 115/64 | Ht 62.75 in | Wt 139.6 lb

## 2021-01-06 DIAGNOSIS — Z3200 Encounter for pregnancy test, result unknown: Secondary | ICD-10-CM

## 2021-01-06 DIAGNOSIS — Z3201 Encounter for pregnancy test, result positive: Secondary | ICD-10-CM

## 2021-01-06 LAB — PREGNANCY, URINE: Preg Test, Ur: POSITIVE — AB

## 2021-01-06 MED ORDER — PRENATAL VITAMINS 28-0.8 MG PO TABS: 1.0000 | ORAL_TABLET | Freq: Every day | ORAL | 0 refills | Status: AC

## 2021-01-06 NOTE — Progress Notes (Addendum)
Patient not seen by provider today secondary to PT positive today. PT flowsheet completed. Positive PT packet and PNV's given. Patient 4.[redacted] weeks pregnant today. Patient wants to come here for [facility]. Sent to clerical to schedule new OB appt when appropriate. Tawny Hopping, RN

## 2021-01-06 NOTE — Progress Notes (Signed)
This provider did not see this patient today.

## 2021-01-06 NOTE — Progress Notes (Signed)
Here today for Depo and PT. Last PE here was 03/25/2020. Last Pap Smear was 01/26/2020 (NIL.) Last Depo was 08/06/2020 (21.6 weeks.) Patient sent to lab for PT. Tawny Hopping, RN

## 2021-01-11 ENCOUNTER — Other Ambulatory Visit: Payer: Self-pay

## 2021-01-11 ENCOUNTER — Emergency Department
Admission: EM | Admit: 2021-01-11 | Discharge: 2021-01-11 | Disposition: A | Discharge status: Home / Self Care | Payer: Self-pay | Attending: Emergency Medicine | Admitting: Emergency Medicine

## 2021-01-11 ENCOUNTER — Emergency Department: Payer: Self-pay

## 2021-01-11 ENCOUNTER — Encounter: Payer: Self-pay | Admitting: Emergency Medicine

## 2021-01-11 DIAGNOSIS — Z3A01 Less than 8 weeks gestation of pregnancy: Secondary | ICD-10-CM | POA: Insufficient documentation

## 2021-01-11 DIAGNOSIS — O209 Hemorrhage in early pregnancy, unspecified: Secondary | ICD-10-CM

## 2021-01-11 DIAGNOSIS — O200 Threatened abortion: Secondary | ICD-10-CM | POA: Insufficient documentation

## 2021-01-11 DIAGNOSIS — O4690 Antepartum hemorrhage, unspecified, unspecified trimester: Secondary | ICD-10-CM

## 2021-01-11 LAB — CBC WITH DIFFERENTIAL/PLATELET
Abs Immature Granulocytes: 0.05 10*3/uL (ref 0.00–0.07)
Basophils Absolute: 0 10*3/uL (ref 0.0–0.1)
Basophils Relative: 0 %
Eosinophils Absolute: 0.1 10*3/uL (ref 0.0–0.5)
Eosinophils Relative: 1 %
HCT: 38.1 % (ref 36.0–46.0)
Hemoglobin: 13.8 g/dL (ref 12.0–15.0)
Immature Granulocytes: 0 %
Lymphocytes Relative: 23 %
Lymphs Abs: 2.6 10*3/uL (ref 0.7–4.0)
MCH: 32.8 pg (ref 26.0–34.0)
MCHC: 36.2 g/dL — ABNORMAL HIGH (ref 30.0–36.0)
MCV: 90.5 fL (ref 80.0–100.0)
Monocytes Absolute: 0.5 10*3/uL (ref 0.1–1.0)
Monocytes Relative: 5 %
Neutro Abs: 8.2 10*3/uL — ABNORMAL HIGH (ref 1.7–7.7)
Neutrophils Relative %: 71 %
Platelets: 214 10*3/uL (ref 150–400)
RBC: 4.21 MIL/uL (ref 3.87–5.11)
RDW: 11.2 % — ABNORMAL LOW (ref 11.5–15.5)
WBC: 11.5 10*3/uL — ABNORMAL HIGH (ref 4.0–10.5)
nRBC: 0 % (ref 0.0–0.2)

## 2021-01-11 LAB — CHLAMYDIA/NGC RT PCR (ARMC ONLY)
Chlamydia Tr: NOT DETECTED
N gonorrhoeae: NOT DETECTED

## 2021-01-11 LAB — POC URINE PREG, ED: Preg Test, Ur: POSITIVE — AB

## 2021-01-11 LAB — ABO/RH: ABO/RH(D): O POS

## 2021-01-11 LAB — WET PREP, GENITAL
Sperm: NONE SEEN
Trich, Wet Prep: NONE SEEN
Yeast Wet Prep HPF POC: NONE SEEN

## 2021-01-11 LAB — HCG, QUANTITATIVE, PREGNANCY: hCG, Beta Chain, Quant, S: 67400 m[IU]/mL — ABNORMAL HIGH (ref <5)

## 2021-01-11 NOTE — ED Provider Notes (Signed)
[facility] Emergency Department Provider Note   ____________________________________________   Event Date/Time   First MD Initiated Contact with Patient 01/11/21 1447     (approximate)  I have reviewed the triage vital signs and the nursing notes.   HISTORY  Chief Complaint Vaginal Bleeding    HPI Robin Hawkins is a 25 y.o. female, G2P1001 at approximately 5 weeks of pregnancy, who presents to the ED complaining of vaginal bleeding.  Patient reports that she has had 3 days of persistent vaginal bleeding, requiring her to change her pad once or twice a day.  She reports mild crampy pain in the left lower quadrant of her abdomen but denies any nausea, vomiting, dysuria, or changes in bowel movements.  She has not yet seen an OB/GYN this pregnancy or had an ultrasound.  She denies any complications with her prior pregnancy.        Past Medical History:  Diagnosis Date   Patient denies medical problems     There are no problems to display for this patient.   Past Surgical History:  Procedure Laterality Date   NO PAST SURGERIES      Prior to Admission medications   Medication Sig Start Date End Date Taking? Authorizing Provider  ibuprofen (ADVIL,MOTRIN) 600 MG tablet Take 1 tablet (600 mg total) by mouth every 6 (six) hours. Patient not taking: No sig reported 05/08/17   Schuman, Jaquelyn Bitter, MD  Prenatal Vit-Fe Fumarate-FA (PRENATAL VITAMINS) 28-0.8 MG TABS Take 1 tablet by mouth daily for 100 doses. 01/06/21 04/16/21  Federico Flake, MD    Allergies Patient has no known allergies.  No family history on file.  Social History Social History   Tobacco Use   Smoking status: Never   Smokeless tobacco: Never  Vaping Use   Vaping Use: Never used  Substance Use Topics   Alcohol use: Not Currently    Comment: sometimes   Drug use: Never    Review of Systems  Constitutional: No fever/chills Eyes: No visual changes. ENT: No  sore throat. Cardiovascular: Denies chest pain. Respiratory: Denies shortness of breath. Gastrointestinal: No abdominal pain.  No nausea, no vomiting.  No diarrhea.  No constipation. Genitourinary: Negative for dysuria.  Positive for vaginal bleeding. Musculoskeletal: Negative for back pain. Skin: Negative for rash. Neurological: Negative for headaches, focal weakness or numbness.  ____________________________________________   PHYSICAL EXAM:  VITAL SIGNS: ED Triage Vitals  Enc Vitals Group     BP 01/11/21 1417 116/67     Pulse Rate 01/11/21 1417 87     Resp 01/11/21 1417 16     Temp 01/11/21 1417 99.2 F (37.3 C)     Temp Source 01/11/21 1417 Oral     SpO2 01/11/21 1417 100 %     Weight 01/11/21 1342 139 lb 8.8 oz (63.3 kg)     Height 01/11/21 1342 5' 2.75" (1.594 m)     Head Circumference --      Peak Flow --      Pain Score 01/11/21 1342 0     Pain Loc --      Pain Edu? --      Excl. in GC? --     Constitutional: Alert and oriented. Eyes: Conjunctivae are normal. Head: Atraumatic. Nose: No congestion/rhinnorhea. Mouth/Throat: Mucous membranes are moist. Neck: Normal ROM Cardiovascular: Normal rate, regular rhythm. Grossly normal heart sounds. Respiratory: Normal respiratory effort.  No retractions. Lungs CTAB. Gastrointestinal: Soft and nontender. No distention. Genitourinary: Cervical os  closed, scant bleeding noted with no discharge. Musculoskeletal: No lower extremity tenderness nor edema. Neurologic:  Normal speech and language. No gross focal neurologic deficits are appreciated. Skin:  Skin is warm, dry and intact. No rash noted. Psychiatric: Mood and affect are normal. Speech and behavior are normal.  ____________________________________________   LABS (all labs ordered are listed, but only abnormal results are displayed)  Labs Reviewed  WET PREP, GENITAL - Abnormal; Notable for the following components:      Result Value   Clue Cells Wet Prep HPF POC  PRESENT (*)    WBC, Wet Prep HPF POC FEW (*)    All other components within normal limits  CBC WITH DIFFERENTIAL/PLATELET - Abnormal; Notable for the following components:   WBC 11.5 (*)    MCHC 36.2 (*)    RDW 11.2 (*)    Neutro Abs 8.2 (*)    All other components within normal limits  HCG, QUANTITATIVE, PREGNANCY - Abnormal; Notable for the following components:   hCG, Beta Chain, Quant, S 67,400 (*)    All other components within normal limits  POC URINE PREG, ED - Abnormal; Notable for the following components:   Preg Test, Ur POSITIVE (*)    All other components within normal limits  CHLAMYDIA/NGC RT PCR ([facility] ONLY)            ABO/RH     PROCEDURES  Procedure(s) performed (including Critical Care):  Procedures   ____________________________________________   INITIAL IMPRESSION / ASSESSMENT AND PLAN / ED COURSE      25 year old female, G2 P1-0-0-1 at approximately 5 weeks of pregnancy, presents to the ED with vaginal bleeding starting 3 days ago with mild crampy pain in the left lower quadrant of her abdomen.  Pelvic exam is reassuring with close cervical os and scant bleeding at this time.  Labs are unremarkable, patient is Rh+ and there is no indication for RhoGAM.  We will further assess for ectopic pregnancy versus other pathology with ultrasound, wet prep is also pending.  Ultrasound shows intrauterine pregnancy with appropriate fetal heart rate and small subchorionic hemorrhage.  Wet prep and GC/committee testing are unremarkable.  Patient is appropriate for discharge home with OB/GYN follow-up, was counseled to return to the ED for new or worsening symptoms.  Patient agrees with plan.      ____________________________________________   FINAL CLINICAL IMPRESSION(S) / ED DIAGNOSES  Final diagnoses:  Threatened miscarriage  Vaginal bleeding in pregnancy     ED Discharge Orders     None        Note:  This document was prepared using Dragon voice  recognition software and may include unintentional dictation errors.    Chesley Noon, MD 01/11/21 [phone]

## 2021-01-11 NOTE — ED Notes (Signed)
See triage note  Presents with some vaginal bleeding  States bleeding has been intermittent for the past 3 days  States she is [redacted] weeks pregnant

## 2021-01-11 NOTE — ED Triage Notes (Signed)
States she is [redacted] weeks pregnant.  C/O vaginal bleeding intermittently x 3 days.  AAOx3.  Skin warm and dry. NAD

## 2021-03-14 NOTE — Progress Notes (Signed)
OB abstraction completed per phone interview.  Assisted by language line interpreter Domingo Cocking id [phone].  Pt aware of new OB appt 03-15-21 at 12:45. Henriette Combs RN

## 2021-03-15 ENCOUNTER — Encounter: Payer: Self-pay | Admitting: Family Medicine

## 2021-03-15 ENCOUNTER — Ambulatory Visit: Payer: Medicaid Other | Admitting: Family Medicine

## 2021-03-15 ENCOUNTER — Other Ambulatory Visit: Payer: Self-pay

## 2021-03-15 VITALS — BP 99/63 | HR 98 | Temp 98.2°F | Wt 138.0 lb

## 2021-03-15 DIAGNOSIS — Z3480 Encounter for supervision of other normal pregnancy, unspecified trimester: Secondary | ICD-10-CM

## 2021-03-15 DIAGNOSIS — Z23 Encounter for immunization: Secondary | ICD-10-CM | POA: Diagnosis not present

## 2021-03-15 DIAGNOSIS — Z3482 Encounter for supervision of other normal pregnancy, second trimester: Secondary | ICD-10-CM | POA: Diagnosis not present

## 2021-03-15 DIAGNOSIS — B3731 Acute candidiasis of vulva and vagina: Secondary | ICD-10-CM

## 2021-03-15 LAB — URINALYSIS
Bilirubin, UA: NEGATIVE
Glucose, UA: NEGATIVE
Leukocytes,UA: NEGATIVE
Nitrite, UA: NEGATIVE
Protein,UA: NEGATIVE
RBC, UA: NEGATIVE
Specific Gravity, UA: 1.02 (ref 1.005–1.030)
Urobilinogen, Ur: 0.2 mg/dL (ref 0.2–1.0)
pH, UA: 7 (ref 5.0–7.5)

## 2021-03-15 LAB — HEMOGLOBIN, FINGERSTICK: Hemoglobin: 11.8 g/dL (ref 11.1–15.9)

## 2021-03-15 LAB — WET PREP FOR TRICH, YEAST, CLUE: Trichomonas Exam: NEGATIVE

## 2021-03-15 MED ORDER — CLOTRIMAZOLE 1 % VA CREA: 1.0000 | TOPICAL_CREAM | Freq: Every day | VAGINAL | 0 refills | Status: AC

## 2021-03-15 NOTE — Progress Notes (Signed)
[facility] HEALTH DEPT [facility] [address] [place] [address] [phone]  INITIAL PRENATAL VISIT NOTE  Subjective:  Robin Hawkins is a 25 y.o. G3P1011 at [redacted]w[redacted]d being seen today to start prenatal care at the [facility] Department.  She is currently monitored for the following issues for this low-risk pregnancy and has Supervision of other normal pregnancy, antepartum on their problem list.  Patient reports nausea.  Contractions: Not present. Vag. Bleeding: None.  Movement: Absent. Denies leaking of fluid.   Indications for ASA therapy (per uptodate) One of the following: Previous pregnancy with preeclampsia, especially early onset and with an adverse outcome No Multifetal gestation No Chronic hypertension No Type 1 or 2 diabetes mellitus No Chronic kidney disease No Autoimmune disease (antiphospholipid syndrome, systemic lupus erythematosus) No  Two or more of the following: Nulliparity No Obesity (body mass index >30 kg/m2) No Family history of preeclampsia in mother or sister No Age ?35 years No Sociodemographic characteristics (African American race, low socioeconomic level) Yes Personal risk factors (eg, previous pregnancy with low birth weight or small for gestational age infant, previous adverse pregnancy outcome [eg, stillbirth], interval >10 years between pregnancies) No   The following portions of the patient's history were reviewed and updated as appropriate: allergies, current medications, past family history, past medical history, past social history, past surgical history and problem list. Problem list updated.  Objective:   Vitals:   03/15/21 1315  BP: 99/63  Pulse: 98  Temp: 98.2 F (36.8 C)  Weight: 138 lb (62.6 kg)    Fetal Status: Fetal Heart Rate (bpm): 143 Fundal Height: 14 cm Movement: Absent  Presentation: Undeterminable   Physical Exam Vitals and nursing note reviewed.   Constitutional:      General: She is not in acute distress.    Appearance: Normal appearance. She is well-developed.  HENT:     Head: Normocephalic and atraumatic.     Right Ear: External ear normal.     Left Ear: External ear normal.     Nose: Nose normal. No congestion or rhinorrhea.     Mouth/Throat:     Lips: Pink.     Mouth: Mucous membranes are moist.     Dentition: Normal dentition. No dental caries.     Pharynx: Oropharynx is clear. Uvula midline.     Comments: Dentition: no visible caries  Eyes:     General: No scleral icterus.    Conjunctiva/sclera: Conjunctivae normal.  Neck:     Thyroid: No thyroid mass, thyromegaly or thyroid tenderness.  Cardiovascular:     Rate and Rhythm: Normal rate and regular rhythm.     Pulses: Normal pulses.     Heart sounds: Normal heart sounds.     Comments: Extremities are warm and well perfused Pulmonary:     Effort: Pulmonary effort is normal.     Breath sounds: Normal breath sounds.  Chest:     Chest wall: No mass.  Breasts:    Tanner Score is 5.     Breasts are symmetrical.     Right: Normal. No mass, nipple discharge or skin change.     Left: Normal. No mass, nipple discharge or skin change.     Comments: Breasts:        Right: Normal. No swelling, mass, nipple discharge, skin change or tenderness.        Left: Normal. No swelling, mass, nipple discharge, skin change or tenderness.   Abdominal:  General: Abdomen is flat.     Palpations: Abdomen is soft.     Tenderness: There is no abdominal tenderness.     Comments: Gravid   Genitourinary:    General: Normal vulva.     Exam position: Lithotomy position.     Pubic Area: No rash.      Labia:        Right: No rash.        Left: No rash.      Vagina: Normal. No vaginal discharge.     Cervix: No cervical motion tenderness or friability.     Uterus: Normal. Enlarged (Gravid 14 size). Not tender.      Adnexa: Right adnexa normal and left adnexa normal.     Rectum:  Normal. No external hemorrhoid.     Comments: External genitalia without, lice, nits, erythema, edema , lesions or inguinal adenopathy. Vagina with normal mucosa and discharge and pH equals 4.  Cervix without visual lesions, uterus firm, mobile, non-tender, no masses, CMT adnexal fullness or tenderness.   Musculoskeletal:     Cervical back: Normal range of motion and neck supple.     Right lower leg: No edema.     Left lower leg: No edema.  Lymphadenopathy:     Cervical: No cervical adenopathy.     Upper Body:     Right upper body: No axillary adenopathy.     Left upper body: No axillary adenopathy.  Skin:    General: Skin is warm and dry.     Capillary Refill: Capillary refill takes less than 2 seconds.  Neurological:     Mental Status: She is alert and oriented to person, place, and time.  Psychiatric:        Mood and Affect: Mood normal.        Behavior: Behavior normal.    Assessment and Plan:  Pregnancy: G3P1011 at [redacted]w[redacted]d  1. Supervision of other normal pregnancy, antepartum  - Dating: has sure LMP, US done on 01/11/21, not dating  - Genetic screening: desires,  - Pregnancy sx: has N/V- counseling given, information sheet with preventive measures given.  - Not Up to date on dental care. Last dental visit was 2016.  Reviewed safety of routine care in pregnancy  - Has support system that is involved , FOB, mother figure, and sister.   - Routine labs to determine anemia status given report  - Vaccinations: flu given today, declined covid vaccines.   - Has one minor risk factors for preeclampsia. ASA 81mg  daily  not recommended for preeclampsia prevention.   - HIV-1/HIV-2 Qualitative RNA - Prenatal profile without Varicella/Rubella ) - HCV Ab w Reflex to Quant PCR - Urine Culture - Chlamydia/GC NAA, Confirmation - Lead, blood (adult age 86 yrs or greater) - Hemoglobinopathy evaluation -121690 - QuantiFERON-TB Gold Plus [id number] 7+Oxycodone-Bund - Hemoglobin,  venipuncture - Urinalysis (Urine Dip) - WET PREP FOR TRICH, YEAST, CLUE  2. Yeast vaginitis - clotrimazole (V-R CLOTRIMAZOLE VAGINAL) 1 % vaginal cream; Place 1 Applicatorful vaginally at bedtime for 7 days.  Dispense: 45 g; Refill: 0  Discussed overview of care and coordination with inpatient delivery practices including [facility], - [id number], [facility] and [facility].    Preterm labor symptoms and general obstetric precautions including but not limited to vaginal bleeding, contractions, leaking of fluid and fetal movement were reviewed in detail with the patient.  Please refer to After Visit Summary for other counseling recommendations.   Return in about 4 weeks (around 04/12/2021) for  routine prenatal care.  No future appointments.  Juliene Pina  used for Spanish interpretation.     Wendi Snipes, FNP

## 2021-03-15 NOTE — Progress Notes (Signed)
Patient here for initial MH RV at [redacted]w[redacted]d based on LMP (exact date).   Wet mount reviewed during clinic and order to treat yeast per SO. Clotrimazole dispensed to patient and education given.   Hgb WNL - reviewed during clinic visit.  Urinalysis reviewed no treatment indicated.    Floy Sabina, RN

## 2021-03-16 DIAGNOSIS — Z3480 Encounter for supervision of other normal pregnancy, unspecified trimester: Secondary | ICD-10-CM | POA: Insufficient documentation

## 2021-03-16 LAB — 789231 7+OXYCODONE-BUND
Amphetamines, Urine: NEGATIVE ng/mL
BENZODIAZ UR QL: NEGATIVE ng/mL
Barbiturate screen, urine: NEGATIVE ng/mL
Cannabinoid Quant, Ur: NEGATIVE ng/mL
Cocaine (Metab.): NEGATIVE ng/mL
OPIATE SCREEN URINE: NEGATIVE ng/mL
Oxycodone/Oxymorphone, Urine: NEGATIVE ng/mL
PCP Quant, Ur: NEGATIVE ng/mL

## 2021-03-16 LAB — LEAD, BLOOD (ADULT >= 16 YRS): Lead-Whole Blood: <1 ug/dL (ref 0.0–3.4)

## 2021-03-17 LAB — HGB FRACTIONATION CASCADE
Hgb A2: 2.5 % (ref 1.8–3.2)
Hgb A: 97.5 % (ref 96.4–98.8)
Hgb F: 0 % (ref 0.0–2.0)
Hgb S: 0 %

## 2021-03-17 LAB — HIV-1/HIV-2 QUALITATIVE RNA
HIV-1 RNA, Qualitative: NONREACTIVE
HIV-2 RNA, Qualitative: NONREACTIVE

## 2021-03-18 LAB — URINE CULTURE: Organism ID, Bacteria: NO GROWTH

## 2021-03-19 LAB — CBC/D/PLT+RPR+RH+ABO+AB SCR
Antibody Screen: NEGATIVE
Basophils Absolute: 0 10*3/uL (ref 0.0–0.2)
Basos: 0 %
EOS (ABSOLUTE): 0.1 10*3/uL (ref 0.0–0.4)
Eos: 1 %
Hematocrit: 34.6 % (ref 34.0–46.6)
Hemoglobin: 11.9 g/dL (ref 11.1–15.9)
Hepatitis B Surface Ag: NEGATIVE
Immature Grans (Abs): 0.1 10*3/uL (ref 0.0–0.1)
Immature Granulocytes: 1 %
Lymphocytes Absolute: 2 10*3/uL (ref 0.7–3.1)
Lymphs: 19 %
MCH: 31.9 pg (ref 26.6–33.0)
MCHC: 34.4 g/dL (ref 31.5–35.7)
MCV: 93 fL (ref 79–97)
Monocytes Absolute: 0.4 10*3/uL (ref 0.1–0.9)
Monocytes: 4 %
Neutrophils Absolute: 7.8 10*3/uL — ABNORMAL HIGH (ref 1.4–7.0)
Neutrophils: 75 %
Platelets: 202 10*3/uL (ref 150–450)
RBC: 3.73 x10E6/uL — ABNORMAL LOW (ref 3.77–5.28)
RDW: 12.2 % (ref 11.7–15.4)
RPR Ser Ql: NONREACTIVE
Rh Factor: POSITIVE
WBC: 10.3 10*3/uL (ref 3.4–10.8)

## 2021-03-19 LAB — QUANTIFERON-TB GOLD PLUS
QuantiFERON Mitogen Value: >10 IU/mL
QuantiFERON Nil Value: 0.07 IU/mL
QuantiFERON TB1 Ag Value: 0.06 IU/mL
QuantiFERON TB2 Ag Value: 0.04 IU/mL
QuantiFERON-TB Gold Plus: NEGATIVE

## 2021-03-19 LAB — CHLAMYDIA/GC NAA, CONFIRMATION
Chlamydia trachomatis, NAA: NEGATIVE
Neisseria gonorrhoeae, NAA: NEGATIVE

## 2021-03-19 LAB — HCV AB W REFLEX TO QUANT PCR: HCV Ab: <0.1 s/co ratio (ref 0.0–0.9)

## 2021-03-19 LAB — HCV INTERPRETATION

## 2021-04-08 NOTE — Addendum Note (Signed)
Addended by: Heywood Bene on: 04/08/2021 10:27 AM   Modules accepted: Orders

## 2021-04-12 ENCOUNTER — Other Ambulatory Visit: Payer: Self-pay

## 2021-04-12 ENCOUNTER — Ambulatory Visit: Payer: Medicaid Other | Admitting: Family Medicine

## 2021-04-12 VITALS — BP 108/71 | HR 102 | Temp 97.5°F | Wt 142.2 lb

## 2021-04-12 DIAGNOSIS — Z3480 Encounter for supervision of other normal pregnancy, unspecified trimester: Secondary | ICD-10-CM

## 2021-04-12 NOTE — Progress Notes (Signed)
[facility]  PRENATAL VISIT NOTE  Subjective:  Robin Hawkins is a 25 y.o. G3P1011 at [redacted]w[redacted]d being seen today for ongoing prenatal care.  She is currently monitored for the following issues for this low-risk pregnancy and has Supervision of other normal pregnancy, antepartum on their problem list.  Patient reports no complaints.  Contractions: Not present. Vag. Bleeding: None.  Movement: Absent. Denies leaking of fluid/ROM.   The following portions of the patient's history were reviewed and updated as appropriate: allergies, current medications, past family history, past medical history, past social history, past surgical history and problem list. Problem list updated.  Objective:   Vitals:   04/12/21 1334  BP: 108/71  Pulse: (!) 102  Temp: (!) 97.5 F (36.4 C)  Weight: 142 lb 3.2 oz (64.5 kg)    Fetal Status: Fetal Heart Rate (bpm): 150 Fundal Height: 17 cm Movement: Absent     General:  Alert, oriented and cooperative. Patient is in no acute distress.  Skin: Skin is warm and dry. No rash noted.   Cardiovascular: Normal heart rate noted  Respiratory: Normal respiratory effort, no problems with respiration noted  Abdomen: Soft, gravid, appropriate for gestational age.  Pain/Pressure: Absent     Pelvic: Cervical exam deferred        Extremities: Normal range of motion.  Edema: None  Mental Status: Normal mood and affect. Normal behavior. Normal judgment and thought content.   Assessment and Plan:  Pregnancy: G3P1011 at [redacted]w[redacted]d  1. Supervision of other normal pregnancy, antepartum - taking PNVs as directed  -discussed importance of selecting pediatrician.  -s/sx of yeast infection gone.   -quad screen today -anatomy US ordered.     - AFP TETRA   Preterm labor symptoms and general obstetric precautions including but not limited to vaginal bleeding, contractions, leaking of fluid and fetal movement were reviewed in detail with  the patient. Please refer to After Visit Summary for other counseling recommendations.  Return in about 4 weeks (around 05/10/2021) for routine prenatal care.  Future Appointments  Date Time Provider Department Center  05/10/2021  1:40 PM [facility] PROVIDER [facility] None   V. Olmedo used for [place] interpretation.     Wendi Snipes, FNP

## 2021-04-12 NOTE — Progress Notes (Signed)
Remains undecided as to pediatrician, but states still has pediatrician list. Desires Quad screen today. Jossie Ng, RN

## 2021-04-13 ENCOUNTER — Telehealth: Payer: Self-pay

## 2021-04-13 NOTE — Telephone Encounter (Signed)
Call to patient, no answer LVM.   Left message asking patient to return call at [phone].   Message patient regarding [place] and that there are 2 options as far as scheduling [place]. (1) Go to [facility] and take $100 deposit and then get billed $100. Or (2) Schedule [place] at [facility] and apply for their financial application in hopes they will cover her cost.   Floy Sabina, RN

## 2021-04-14 ENCOUNTER — Telehealth: Payer: Self-pay

## 2021-04-14 ENCOUNTER — Other Ambulatory Visit: Payer: Self-pay | Admitting: Family Medicine

## 2021-04-14 DIAGNOSIS — R772 Abnormality of alphafetoprotein: Secondary | ICD-10-CM | POA: Insufficient documentation

## 2021-04-14 LAB — AFP TETRA
DIA Mom Value: 1.08
DIA Value (EIA): 177.18 pg/mL
DSR (By Age)    1 IN: 1052
DSR (Second Trimester) 1 IN: 10000
Gestational Age: 18 WEEKS
MSAFP Mom: 7.51
MSAFP: 355.4 ng/mL
MSHCG Mom: 0.44
MSHCG: 14005 m[IU]/mL
Maternal Age At EDD: 24.5 yr
Osb Risk: 10
T18 (By Age): 1:4098 {titer}
Test Results:: POSITIVE — AB
Weight: 142 [lb_av]
uE3 Mom: 1.01
uE3 Value: 1.55 ng/mL

## 2021-04-14 NOTE — Progress Notes (Signed)
Orders for MFM for anatomy U/S and follow up for + AFP screening   Wendi Snipes, FNP

## 2021-04-14 NOTE — Telephone Encounter (Signed)
Referred for MFM consult and ultrasound received today and faxed with confirmation.   Floy Sabina, RN

## 2021-04-18 ENCOUNTER — Telehealth: Payer: Self-pay

## 2021-04-18 NOTE — Telephone Encounter (Signed)
Client called directly into clinic and provided number to call when needs to speak with [facility] RN. Client inquiring about [place] appt and RN had just received appt from [facility] in [facility] MFM scheduling. [place] with MFM consult scheduled for 05/17/2021 at 1500. Appt information provided to client. Jossie Ng, RN

## 2021-05-10 ENCOUNTER — Telehealth: Payer: Self-pay

## 2021-05-10 ENCOUNTER — Ambulatory Visit: Payer: Medicaid Other

## 2021-05-10 NOTE — Telephone Encounter (Signed)
As unsuccessful attempt previously made in early afternoon to reschedule missed [facility] RV appt, call made to client with [facility] [employer] ID# [phone]. Left message to call requesting she reschedule appt and number to call provided. Jossie Ng, RN

## 2021-05-10 NOTE — Telephone Encounter (Signed)
[facility] as scheduled in [facility] 05/10/2021. Call to client with Marlene Yemen and left message requesting she reschedule appt. Number to call provided.

## 2021-05-11 NOTE — Telephone Encounter (Signed)
Call to patient requesting she reschedule appt and appointment rescheduled for 05/18/21 at 0920 arrival time. Pt is also aware of [place] on 05/17/21 for 2:30 arrival time.   Floy Sabina, RN

## 2021-05-12 ENCOUNTER — Other Ambulatory Visit: Payer: Self-pay

## 2021-05-12 DIAGNOSIS — R772 Abnormality of alphafetoprotein: Secondary | ICD-10-CM

## 2021-05-17 ENCOUNTER — Ambulatory Visit: Payer: Medicaid Other | Attending: Maternal & Fetal Medicine

## 2021-05-17 ENCOUNTER — Other Ambulatory Visit: Payer: Self-pay

## 2021-05-17 ENCOUNTER — Ambulatory Visit (HOSPITAL_BASED_OUTPATIENT_CLINIC_OR_DEPARTMENT_OTHER): Payer: Medicaid Other

## 2021-05-17 ENCOUNTER — Other Ambulatory Visit: Payer: Self-pay | Admitting: Family Medicine

## 2021-05-17 DIAGNOSIS — O35DXX0 Maternal care for other (suspected) fetal abnormality and damage, fetal gastrointestinal anomalies, not applicable or unspecified: Secondary | ICD-10-CM | POA: Insufficient documentation

## 2021-05-17 DIAGNOSIS — O280 Abnormal hematological finding on antenatal screening of mother: Secondary | ICD-10-CM

## 2021-05-17 DIAGNOSIS — Z3689 Encounter for other specified antenatal screening: Secondary | ICD-10-CM | POA: Diagnosis not present

## 2021-05-17 DIAGNOSIS — Z3A23 23 weeks gestation of pregnancy: Secondary | ICD-10-CM | POA: Diagnosis not present

## 2021-05-17 DIAGNOSIS — O283 Abnormal ultrasonic finding on antenatal screening of mother: Secondary | ICD-10-CM

## 2021-05-17 DIAGNOSIS — O0932 Supervision of pregnancy with insufficient antenatal care, second trimester: Secondary | ICD-10-CM | POA: Diagnosis present

## 2021-05-17 DIAGNOSIS — R772 Abnormality of alphafetoprotein: Secondary | ICD-10-CM

## 2021-05-17 NOTE — Progress Notes (Addendum)
Referring Provider:  [facility] Department Length of consultation 60 minutes  Robin Hawkins was referred for genetic counseling at [facility] to for a detailed ultrasound and to discuss the results of her Maternal Serum Screen and her prenatal testing options.  This note is a summary of our discussion. The patient was present at the visit alone, though a Spanish interpreter was present for the visit.  To review, a Maternal Serum AFP is a maternal blood test that measures maternal serum AFP levels to determine if a pregnancy is at higher risk for certain birth defects or problems; however, it cannot diagnose or rule out these conditions.  It is important to remember that an abnormal maternal serum screen (MSS) test does not necessarily mean that the baby has a problem.  Maternal serum screening can identify approximately 80% of open neural tube defects (i.e., spina bifida).  The neural tube consists of the fetal head and spine, and if this structure fails to close during development, spina bifida (open spine) or anencephaly (open skull) could result.   The MSS result revealed an increased level of AFP.  The value was 7.51 MOM (multiples of the median) which is above the cutoff of 2.5 MOM.  Increased levels of AFP occur for many reasons including: inaccurate pregnancy dating, the presence of twins, pregnancy complications, neural tube defects, abdominal wall defects or a normal pregnancy.  Before MSS screening, her risk to have a child with a neural tube defect was 1 or 2 per 1000. Given the elevated AFP value, the risk is now 1 in 10. The risk for Down syndrome by this screening was estimated to be 1 in 10,000 and the chance for Trisomy 18 was 1 in 4,098.  Ultrasound was performed at the time of this visit to evaluate the fetal anatomy.  See the ultrasound report for details.  There was no evidence of a neural tube defect on this ultrasound, however, the abdominal wall was  noted to be abnormal.  Specifically, there was an anterior abdominal wall defect with intestine protruding.  The remainder of the fetal anatomy appeared normal, though some views were suboptimally seen due to fetal position.  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  Gastroschisis is a defect in the anterior abdominal wall lateral to the umbilicus with no covering sac, resulting in the herniation of viscera through the opening, typically limited to intestine.  Gastroschisis is usually isolated, meaning that other anomalies and chromosome problems are uncommon (approximately 5% have other anomalies).  Potential complications include thickening of the intestinal wall, intestinal obstruction/malrotation/necrosis, premature delivery and growth restriction.  The survival rate for gastroschisis is approximately 90%, but significant prematurity or problems in other organ systems can decrease this number.  Due to the associated gastrointestinal problems, there may be feeding difficulties after birth and closure is typically achieved over a period of time.  Most cases are sporadic.  While a risk of recurrence of less than 1% has been reported in the past, more recent studies suggest a recurrence risk of 2.4 to 3.5%.  Thus, increased surveillance should be considered in future pregnancies.   Amniocentesis was offered due to today's findings.  This procedure involves the removal of a small amount of amniotic fluid from the sac surrounding the fetus with the use of a thin needle through the maternal abdomen and uterus.  Fetal cells are directly evaluated to assess for chromosome conditions and chromosomal microarray as well as single gene  disorders if appropriate. Amniocentesis can also detect greater than 98% of open neural tube defects.   Ultrasound guidance is used throughout the procedure. The main risks to this procedure include complications leading to miscarriage in less than 1 in 500 cases  (0.2%).    It is important to remember that each pregnancy in the general population has a 2-3% chance for a birth defect that might not be detected prenatally.  We recommend 0.4mg  of folic acid, a B-complex vitamin, for all women of childbearing age.  Folic acid may help to prevent neural tube defects, and should be taken prior to and during pregnancy.  Cystic Fibrosis and Spinal Muscular Atrophy (SMA) screening were also discussed with the patient. Both conditions are recessive, which means that both parents must be carriers in order to have a child with the disease.  Cystic fibrosis (CF) is one of the most common genetic conditions in persons of Caucasian ancestry.  This condition occurs in approximately 1 in 2,500 Caucasian persons and results in thickened secretions in the lungs, digestive, and reproductive systems.  For a baby to be at risk for having CF, both of the parents must be carriers for this condition.  Approximately 1 in 81 Caucasian persons is a carrier for CF.  Current carrier testing looks for the most common mutations in the gene for CF and can detect approximately 90% of carriers in the Caucasian population.  This means that the carrier screening can greatly reduce, but cannot eliminate, the chance for an individual to have a child with CF.  If an individual is found to be a carrier for CF, then carrier testing would be available for the partner. As part of [address] newborn screening profile, all babies born in the state of [place] will have a two-tier screening process.  Specimens are first tested to determine the concentration of immunoreactive trypsinogen (IRT).  The top 5% of specimens with the highest IRT values then undergo DNA testing using a panel of over 40 common CF mutations. SMA is a neurodegenerative disorder that leads to atrophy of skeletal muscle and overall weakness.  This condition is also more prevalent in the Caucasian population, with 1 in 40-1 in 60  persons being a carrier and 1 in 6,000-1 in 10,000 children being affected.  There are multiple forms of the disease, with some causing death in infancy to other forms with survival into adulthood.  The genetics of SMA is complex, but carrier screening can detect up to 95% of carriers in the Caucasian population.  Similar to CF, a negative result can greatly reduce, but cannot eliminate, the chance to have a child with SMA. Hemoglobinopathy screening was previously performed and was normal (AA, MCV 90).  We obtained a detailed family history, and the patient reported that this is her second pregnancy. She has a healthy 48 year old son from a prior relationship. There are no reported family members with known genetic conditions, developmental delays or birth defects.  We also obtained a detailed pregnancy history. Ms. Anaiya Wisinski reported no complications thus far and deny the use of prescription medications, recreational drugs, tobacco or alcohol.   Plan of Care: Follow up ultrasound in [facility] tomorrow (05/18/21 at 3:15pm) for additional imaging. We will then schedule follow up ultrasounds for growth. After counseling, the patient indicated that she will decline amniocentesis provided no additional anomalies are noted. Given the normal results for Trisomy 21 and 18 on the quad screen, cell free fetal DNA testing  was not offered. We also reviewed the option of pregnancy termination, which would be out of state given the gestational age, and the patient declined at this time. Ms. Reah Justo also declined carrier screening for CF and SMA.  If further questions or concerns arise, please do not hesitate to call us at [phone].   Cherly Anderson, MS, CGC

## 2021-05-18 ENCOUNTER — Ambulatory Visit: Payer: Medicaid Other | Admitting: *Deleted

## 2021-05-18 ENCOUNTER — Other Ambulatory Visit: Payer: Self-pay

## 2021-05-18 ENCOUNTER — Ambulatory Visit (HOSPITAL_BASED_OUTPATIENT_CLINIC_OR_DEPARTMENT_OTHER): Payer: Medicaid Other | Admitting: Obstetrics

## 2021-05-18 ENCOUNTER — Encounter: Payer: Self-pay | Admitting: Nurse Practitioner

## 2021-05-18 ENCOUNTER — Ambulatory Visit: Payer: Medicaid Other | Attending: Maternal & Fetal Medicine

## 2021-05-18 ENCOUNTER — Ambulatory Visit: Payer: Medicaid Other | Admitting: Nurse Practitioner

## 2021-05-18 ENCOUNTER — Other Ambulatory Visit: Payer: Self-pay | Admitting: *Deleted

## 2021-05-18 VITALS — BP 133/83 | HR 120

## 2021-05-18 VITALS — BP 117/74 | HR 73 | Temp 97.3°F | Wt 144.8 lb

## 2021-05-18 DIAGNOSIS — O35DXX0 Maternal care for other (suspected) fetal abnormality and damage, fetal gastrointestinal anomalies, not applicable or unspecified: Secondary | ICD-10-CM

## 2021-05-18 DIAGNOSIS — R772 Abnormality of alphafetoprotein: Secondary | ICD-10-CM | POA: Insufficient documentation

## 2021-05-18 DIAGNOSIS — Z3A23 23 weeks gestation of pregnancy: Secondary | ICD-10-CM | POA: Insufficient documentation

## 2021-05-18 DIAGNOSIS — O280 Abnormal hematological finding on antenatal screening of mother: Secondary | ICD-10-CM

## 2021-05-18 DIAGNOSIS — Z363 Encounter for antenatal screening for malformations: Secondary | ICD-10-CM | POA: Diagnosis present

## 2021-05-18 DIAGNOSIS — O358XX0 Maternal care for other (suspected) fetal abnormality and damage, not applicable or unspecified: Secondary | ICD-10-CM | POA: Diagnosis not present

## 2021-05-18 DIAGNOSIS — Z3480 Encounter for supervision of other normal pregnancy, unspecified trimester: Secondary | ICD-10-CM

## 2021-05-18 DIAGNOSIS — Z3482 Encounter for supervision of other normal pregnancy, second trimester: Secondary | ICD-10-CM

## 2021-05-18 NOTE — Progress Notes (Signed)
[facility]  PRENATAL VISIT NOTE  Subjective:  Robin Hawkins is a 25 y.o. G3P1011 at [redacted]w[redacted]d being seen today for ongoing prenatal care.  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  Patient reports no complaints.  Contractions: Not present. Vag. Bleeding: None.  Movement: Present. Denies leaking of fluid/ROM.   The following portions of the patient's history were reviewed and updated as appropriate: allergies, current medications, past family history, past medical history, past social history, past surgical history and problem list. Problem list updated.  Objective:   Vitals:   05/18/21 0952  BP: 117/74  Pulse: 73  Temp: (!) 97.3 F (36.3 C)  Weight: 144 lb 12.8 oz (65.7 kg)    Fetal Status: Fetal Heart Rate (bpm): 140 Fundal Height: 23 cm Movement: Present     General:  Alert, oriented and cooperative. Patient is in no acute distress.  Skin: Skin is warm and dry. No rash noted.   Cardiovascular: Normal heart rate noted  Respiratory: Normal respiratory effort, no problems with respiration noted  Abdomen: Soft, gravid, appropriate for gestational age.  Pain/Pressure: Absent     Pelvic: Cervical exam deferred        Extremities: Normal range of motion.  Edema: None  Mental Status: Normal mood and affect. Normal behavior. Normal judgment and thought content.   Assessment and Plan:  Pregnancy: G3P1011 at [redacted]w[redacted]d  1. Supervision of other normal pregnancy, antepartum -25 year old female in clinic for routine prenatal visit.  -Patient states she is taking her PNV daily.    2. Fetal gastroschisis during pregnancy, antepartum, single or unspecified fetus   - Patient had an ultrasound on 05/17/2021.  Ultrasound showed gastroschisis.   Patient to have follow up ultrasound on 05/18/2021 at 315 PM with MFM.    Term labor symptoms and general obstetric precautions including but not limited to vaginal bleeding, contractions, leaking of fluid and fetal movement were reviewed in detail with the patient. Please refer to After Visit Summary for other counseling recommendations.  Return in about 4 weeks (around 06/15/2021) for Routine prenatal care visit.  Future Appointments  Date Time Provider Department Center  05/18/2021  3:15 PM [facility] US2 [facility]  06/15/2021 11:00 AM [facility] PROVIDER AC-MAT None    Glenna Fellows, FNP

## 2021-05-18 NOTE — Progress Notes (Signed)
Verified client has pediatrician resource list as remains undecided regarding pediatric care pp. Kept 05/17/2021 [facility] [place] and aware of [facility] [place] appt today in [place] at 1515. Jossie Ng, RN

## 2021-05-18 NOTE — Progress Notes (Signed)
MFM Note  Robin Hawkins was seen for a detailed fetal anatomy scan due to an elevated MSAFP of 7.51 MoM noted on her quad screen.  The final Down syndrome risk noted on her quad screen was 1:10,000 and her trisomy 18 risk was not increased.  She denies any significant past medical history and denies any problems in her current pregnancy.  She reports one prior uncomplicated full-term vaginal delivery.  During her ultrasound performed yesterday, fetal gastroschisis is suspected.  She was seen for an ultrasound today to confirm the diagnosis.  On today's exam, gastroschisis was noted.  The loops of bowel appeared to be floating outside of the fetal abdomen.  The fetal bowel did not appear dilated today.  The loops of bowel appear to be herniating out of the abdomen to the right of a normal-appearing abdominal cord insertion site.  The patient was advised that the most likely cause of her elevated MSAFP is due to gastroschisis.  There were no signs of spina bifida noted today.  The implications and management of gastroschisis was discussed in detail with the patient and her husband.  They were advised that gastroschisis is an anomaly that can be surgically repaired after birth, usually with good outcomes.  They understand that the baby may require a prolonged NICU stay if the loops of bowel cannot be placed back into the abdominal cavity all at once.  The increased risk of bowel atresia and the short gut syndrome noted in fetuses with gastroschisis was discussed.    We will arrange a prenatal consultation with pediatric surgery later in her pregnancy to discuss the management and repair of gastroschisis after birth.   The increased risk of IUGR in fetuses with gastroschisis was discussed.  We will continue to follow her with monthly growth ultrasounds and we will present her case at a future [facility] meeting.  After all management options for her pregnancy were discussed with the patient and her  husband, they have decided to continue with the pregnancy.  Although gastroschisis is usually not associated with chromosomal anomalies, due to her elevated MSAFP level, the patient was offered and declined an amniocentesis today for definitive diagnosis of fetal chromosomal abnormalities and for definitive diagnosis of spina bifida.  She was given a referral for a fetal echocardiogram with [facility] pediatric cardiology.  A follow-up exam was scheduled in 4 weeks.  The patient and her husband stated that all of their questions have been answered to their complete satisfaction.   All conversations were held with the patient today with the help of a Spanish interpreter.  A total of 45 minutes was spent counseling and coordinating the care for this patient.  Greater than 50% of the time was spent in direct face-to-face contact.

## 2021-05-24 ENCOUNTER — Telehealth: Payer: Self-pay

## 2021-05-24 NOTE — Telephone Encounter (Signed)
Patient schedule for Fetal Echo with [facility] on 06/27/2021 at 11:30am seeing Dr. Lance Bosch

## 2021-05-26 ENCOUNTER — Ambulatory Visit: Payer: Self-pay

## 2021-06-15 ENCOUNTER — Ambulatory Visit: Payer: Medicaid Other | Admitting: *Deleted

## 2021-06-15 ENCOUNTER — Other Ambulatory Visit: Payer: Self-pay

## 2021-06-15 ENCOUNTER — Ambulatory Visit: Payer: Medicaid Other | Admitting: Advanced Practice Midwife

## 2021-06-15 ENCOUNTER — Other Ambulatory Visit: Payer: Self-pay | Admitting: Obstetrics

## 2021-06-15 ENCOUNTER — Ambulatory Visit: Payer: Medicaid Other | Attending: Obstetrics

## 2021-06-15 VITALS — BP 98/64 | HR 98 | Temp 97.9°F | Wt 152.6 lb

## 2021-06-15 VITALS — BP 114/65 | HR 100

## 2021-06-15 DIAGNOSIS — Z3A27 27 weeks gestation of pregnancy: Secondary | ICD-10-CM | POA: Diagnosis not present

## 2021-06-15 DIAGNOSIS — O35DXX0 Maternal care for other (suspected) fetal abnormality and damage, fetal gastrointestinal anomalies, not applicable or unspecified: Secondary | ICD-10-CM

## 2021-06-15 DIAGNOSIS — R772 Abnormality of alphafetoprotein: Secondary | ICD-10-CM

## 2021-06-15 DIAGNOSIS — Z3483 Encounter for supervision of other normal pregnancy, third trimester: Secondary | ICD-10-CM

## 2021-06-15 DIAGNOSIS — Z23 Encounter for immunization: Secondary | ICD-10-CM

## 2021-06-15 DIAGNOSIS — Z3480 Encounter for supervision of other normal pregnancy, unspecified trimester: Secondary | ICD-10-CM

## 2021-06-15 LAB — HEMOGLOBIN, FINGERSTICK: Hemoglobin: 12.7 g/dL (ref 11.1–15.9)

## 2021-06-15 MED ORDER — PRENATAL MULTIVITAMIN CH: 1.0000 | ORAL_TABLET | Freq: Every day | ORAL | 0 refills | Status: DC

## 2021-06-15 NOTE — Progress Notes (Signed)
[place] Department Maternal Health Clinic  PRENATAL VISIT NOTE  Subjective:  Robin Hawkins is a 26 y.o. G3P1011 at [redacted]w[redacted]d being seen today for ongoing prenatal care.  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  Patient reports no complaints.  Contractions: Not present. Vag. Bleeding: None.  Movement: Present. Denies leaking of fluid/ROM.   The following portions of the patient's history were reviewed and updated as appropriate: allergies, current medications, past family history, past medical history, past social history, past surgical history and problem list. Problem list updated.  Objective:   Vitals:   06/15/21 1114  BP: 98/64  Pulse: 98  Temp: 97.9 F (36.6 C)  Weight: 152 lb 9.6 oz (69.2 kg)    Fetal Status: Fetal Heart Rate (bpm): 140 Fundal Height: 28 cm Movement: Present     General:  Alert, oriented and cooperative. Patient is in no acute distress.  Skin: Skin is warm and dry. No rash noted.   Cardiovascular: Normal heart rate noted  Respiratory: Normal respiratory effort, no problems with respiration noted  Abdomen: Soft, gravid, appropriate for gestational age.  Pain/Pressure: Absent     Pelvic: Cervical exam deferred        Extremities: Normal range of motion.  Edema: None  Mental Status: Normal mood and affect. Normal behavior. Normal judgment and thought content.   Assessment and Plan:  Pregnancy: G3P1011 at [redacted]w[redacted]d  1. Supervision of other normal pregnancy, antepartum Not working Not exercising--encouraged to do so 3-4x/wk x 15 min 22 lb 9.6 oz (10.3 kg) 8 lb wt gain in past 4 wks 1 hour glucola done today - Hemoglobin, fingerstick - Glucose, 1 hour gestational - HIV-1/HIV-2 Qualitative RNA - RPR - Tdap vaccine  greater than or equal to 7yo IM - Prenatal Vit-Fe Fumarate-FA (PRENATAL MULTIVITAMIN) TABS tablet; Take 1 tablet by mouth daily at 12 noon.  Dispense: 100 tablet; Refill: 0  2. Fetal gastroschisis during pregnancy, antepartum, single or unspecified fetus Dx'd 05/17/21 by u/s with anterior placenta, subjectively low AFI, 3VC, EFW=12% at 22 5/7  Recommended serial u/s q 4 wks beginning 24 wks, weekly BPP or 2x/wk NST's beginning 34 wks, with delivery at 37 wks Fetal ECHO 06/27/21 Has u/s today Questions answered   3. Abnormal alpha fetoprotein (AFP) level Gastrochesis dx'd 05/17/21   Preterm labor symptoms and general obstetric precautions including but not limited to vaginal bleeding, contractions, leaking of fluid and fetal movement were reviewed in detail with the patient. Please refer to After Visit Summary for other counseling recommendations.  Return in about 2 weeks (around 06/29/2021) for routine PNC.  Future Appointments  Date Time Provider [place]  06/15/2021  3:30 PM [facility] NURSE [facility]  06/15/2021  3:45 PM [facility] US1 [facility]  06/29/2021  1:20 PM [facility] PROVIDER AC-MAT None    Herbie Saxon, CNM

## 2021-06-15 NOTE — Progress Notes (Signed)
Hgb reviewed during clinic visit.   Floy Sabina, RN

## 2021-06-16 ENCOUNTER — Other Ambulatory Visit: Payer: Self-pay | Admitting: *Deleted

## 2021-06-16 DIAGNOSIS — O35DXX0 Maternal care for other (suspected) fetal abnormality and damage, fetal gastrointestinal anomalies, not applicable or unspecified: Secondary | ICD-10-CM

## 2021-06-16 DIAGNOSIS — O365920 Maternal care for other known or suspected poor fetal growth, second trimester, not applicable or unspecified: Secondary | ICD-10-CM

## 2021-06-16 LAB — GLUCOSE, 1 HOUR GESTATIONAL: Gestational Diabetes Screen: 111 mg/dL (ref 70–139)

## 2021-06-16 LAB — SYPHILIS: RPR W/REFLEX TO RPR TITER AND TREPONEMAL ANTIBODIES, TRADITIONAL SCREENING AND DIAGNOSIS ALGORITHM: RPR Ser Ql: NONREACTIVE

## 2021-06-16 NOTE — Progress Notes (Unsigned)
u

## 2021-06-17 LAB — HIV-1/HIV-2 QUALITATIVE RNA
HIV-1 RNA, Qualitative: NONREACTIVE
HIV-2 RNA, Qualitative: NONREACTIVE

## 2021-06-29 ENCOUNTER — Ambulatory Visit: Payer: Medicaid Other | Admitting: *Deleted

## 2021-06-29 ENCOUNTER — Ambulatory Visit: Payer: Medicaid Other

## 2021-06-29 ENCOUNTER — Ambulatory Visit: Payer: Medicaid Other | Attending: Obstetrics and Gynecology

## 2021-06-29 ENCOUNTER — Other Ambulatory Visit: Payer: Self-pay

## 2021-06-29 VITALS — BP 109/62 | HR 88

## 2021-06-29 DIAGNOSIS — O365930 Maternal care for other known or suspected poor fetal growth, third trimester, not applicable or unspecified: Secondary | ICD-10-CM

## 2021-06-29 DIAGNOSIS — O35DXX1 Maternal care for other (suspected) fetal abnormality and damage, fetal gastrointestinal anomalies, fetus 1: Secondary | ICD-10-CM

## 2021-06-29 DIAGNOSIS — Z3A29 29 weeks gestation of pregnancy: Secondary | ICD-10-CM

## 2021-06-29 DIAGNOSIS — O35DXX0 Maternal care for other (suspected) fetal abnormality and damage, fetal gastrointestinal anomalies, not applicable or unspecified: Secondary | ICD-10-CM | POA: Diagnosis not present

## 2021-06-29 DIAGNOSIS — O365920 Maternal care for other known or suspected poor fetal growth, second trimester, not applicable or unspecified: Secondary | ICD-10-CM | POA: Diagnosis present

## 2021-07-04 ENCOUNTER — Other Ambulatory Visit: Payer: Self-pay | Admitting: *Deleted

## 2021-07-04 DIAGNOSIS — R772 Abnormality of alphafetoprotein: Secondary | ICD-10-CM

## 2021-07-04 DIAGNOSIS — O35DXX0 Maternal care for other (suspected) fetal abnormality and damage, fetal gastrointestinal anomalies, not applicable or unspecified: Secondary | ICD-10-CM

## 2021-07-04 DIAGNOSIS — O365930 Maternal care for other known or suspected poor fetal growth, third trimester, not applicable or unspecified: Secondary | ICD-10-CM

## 2021-07-06 ENCOUNTER — Ambulatory Visit: Payer: Medicaid Other | Attending: Obstetrics and Gynecology

## 2021-07-06 ENCOUNTER — Ambulatory Visit: Payer: Medicaid Other | Admitting: Advanced Practice Midwife

## 2021-07-06 ENCOUNTER — Ambulatory Visit: Payer: Medicaid Other | Admitting: *Deleted

## 2021-07-06 ENCOUNTER — Other Ambulatory Visit: Payer: Self-pay

## 2021-07-06 VITALS — BP 113/68 | HR 86

## 2021-07-06 VITALS — BP 118/70 | HR 98 | Temp 97.1°F | Wt 156.4 lb

## 2021-07-06 DIAGNOSIS — O35DXX1 Maternal care for other (suspected) fetal abnormality and damage, fetal gastrointestinal anomalies, fetus 1: Secondary | ICD-10-CM

## 2021-07-06 DIAGNOSIS — O365920 Maternal care for other known or suspected poor fetal growth, second trimester, not applicable or unspecified: Secondary | ICD-10-CM | POA: Insufficient documentation

## 2021-07-06 DIAGNOSIS — O35DXX0 Maternal care for other (suspected) fetal abnormality and damage, fetal gastrointestinal anomalies, not applicable or unspecified: Secondary | ICD-10-CM

## 2021-07-06 DIAGNOSIS — O365930 Maternal care for other known or suspected poor fetal growth, third trimester, not applicable or unspecified: Secondary | ICD-10-CM

## 2021-07-06 DIAGNOSIS — Z3480 Encounter for supervision of other normal pregnancy, unspecified trimester: Secondary | ICD-10-CM

## 2021-07-06 DIAGNOSIS — O365911 Maternal care for other known or suspected poor fetal growth, first trimester, fetus 1: Secondary | ICD-10-CM | POA: Insufficient documentation

## 2021-07-06 DIAGNOSIS — Z3A30 30 weeks gestation of pregnancy: Secondary | ICD-10-CM | POA: Diagnosis not present

## 2021-07-06 NOTE — Progress Notes (Signed)
[place] Department Maternal [facility] Clinic  PRENATAL VISIT NOTE  Subjective:  Robin Hawkins is a 26 y.o. G3P1011 at [redacted]w[redacted]d being seen today for ongoing prenatal care.  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  Patient reports no complaints.  Contractions: Not present. Vag. Bleeding: None.  Movement: Present. Denies leaking of fluid/ROM.   The following portions of the patient's history were reviewed and updated as appropriate: allergies, current medications, past family history, past medical history, past social history, past surgical history and problem list. Problem list updated.  Objective:   Vitals:   07/06/21 0827  BP: 118/70  Pulse: 98  Temp: (!) 97.1 F (36.2 C)  Weight: 156 lb 6.4 oz (70.9 kg)    Fetal Status: Fetal Heart Rate (bpm): 140 Fundal Height: 29 cm Movement: Present     General:  Alert, oriented and cooperative. Patient is in no acute distress.  Skin: Skin is warm and dry. No rash noted.   Cardiovascular: Normal heart rate noted  Respiratory: Normal respiratory effort, no problems with respiration noted  Abdomen: Soft, gravid, appropriate for gestational age.  Pain/Pressure: Absent     Pelvic: Cervical exam deferred        Extremities: Normal range of motion.  Edema: None  Mental Status: Normal mood and affect. Normal behavior. Normal judgment and thought content.   Assessment and Plan:  Pregnancy: G3P1011 at [redacted]w[redacted]d  1. Supervision of other normal pregnancy, antepartum 1 hour glucola=111 on 06/15/21 Not working. Living with her husband, her mom, her 63 yo sister Walking 1x/wk x 1 hour  2. Fetal gastroschisis during pregnancy, antepartum, single or unspecified fetus First dating u/s 01/11/21 @ 6 3/7 Dx'd 05/17/21 at  [facility] u/s with gastroschesis with subjectively low-normal AFI, EFW=12%,  anterior placenta at 22 5/7. Repeat u/s in 48 hrs to confirm dx 05/18/21 u/s MFM confirms gastroschesis at 22 6/7 U/s with MFM 06/15/21 AFI wnl, EFW=8%, AC=7%, anterior placenta at 27.0 wks, dopplers wnl, f/u u/s 3 wks U/s 06/29/21 at 29 4/7 with dopplers wnl, AFI wnl, anterior placenta. F/u growth u/s and dopplers in 1 week Kept fetal ECHO 06/27/21 wnl Neonatology consult today 07/06/21 to determine delivery site Needs weekly BPP or NST 2x/wk beginning 34 wks Needs delivery by 37 wks    Preterm labor symptoms and general obstetric precautions including but not limited to vaginal bleeding, contractions, leaking of fluid and fetal movement were reviewed in detail with the patient. Please refer to After Visit Summary for other counseling recommendations.  Return in about 2 weeks (around 07/20/2021) for routine PNC.  Future Appointments  Date Time Provider [place]  07/06/2021  3:30 PM [facility] NURSE [facility]  07/06/2021  3:45 PM [facility] US5 [facility]  07/13/2021  2:30 PM [facility] NURSE [facility]  07/13/2021  2:45 PM [facility] US6 [facility]  07/20/2021  9:00 AM AC-MH PROVIDER AC-MAT None  07/20/2021  3:30 PM [facility] NURSE [facility]  07/20/2021  3:45 PM [facility] US6 [facility]  07/27/2021  3:30 PM [facility] NURSE [facility]  07/27/2021  3:45 PM [facility] US6 [facility] [place]    Herbie Saxon, CNM

## 2021-07-06 NOTE — Progress Notes (Addendum)
Here today for 30.4 week MH RV. Taking PNV QD. Kept 06/27/21 Fetal echo appt in [place]. Has follow up appt today at 1:30 and is aware. Denies any further ED/hospital visits since last RV. Kick Count cards and instructions given. Undecided Peds choice has info. Tawny Hopping, RN

## 2021-07-07 ENCOUNTER — Other Ambulatory Visit: Payer: Self-pay

## 2021-07-07 ENCOUNTER — Telehealth: Payer: Self-pay | Admitting: Neonatology

## 2021-07-07 ENCOUNTER — Other Ambulatory Visit (INDEPENDENT_AMBULATORY_CARE_PROVIDER_SITE_OTHER): Payer: Self-pay | Admitting: Family Medicine

## 2021-07-07 ENCOUNTER — Encounter: Payer: Self-pay | Admitting: Neonatology

## 2021-07-07 ENCOUNTER — Other Ambulatory Visit: Payer: Self-pay | Admitting: *Deleted

## 2021-07-07 DIAGNOSIS — O35DXX0 Maternal care for other (suspected) fetal abnormality and damage, fetal gastrointestinal anomalies, not applicable or unspecified: Secondary | ICD-10-CM

## 2021-07-07 DIAGNOSIS — R772 Abnormality of alphafetoprotein: Secondary | ICD-10-CM

## 2021-07-07 DIAGNOSIS — O365930 Maternal care for other known or suspected poor fetal growth, third trimester, not applicable or unspecified: Secondary | ICD-10-CM

## 2021-07-07 NOTE — Progress Notes (Signed)
Neonatology Consultation Video Visit   I had the pleasure of meeting with Ms. Sheralyn Boatman by video visit today at 2:15 PM to discuss the expected post-natal course for her baby, who has a fetal diagnosis of gastroschisis. Ms. Aurorah Schlachter is currently at 30 weeks' gestation. Gastroschisis was diagnosed back in December 2022 when one loop of bowel was seen outside the fetal abdomen. Mild growth restriction has been seen (10%ile, AC 3%ile). There are no other anomalies seen, fetal activity and amniotic fluid levels are normal (most recent US was yesterday, 07/06/21). Fetal echocardiogram was normal on 06/27/21.  An in-person Spanish interpreter assisted with this visit.   I elicited Ms. Dudley Major Sanchez's understanding of her baby's condition. We then discussed that the NICU team will present at her delivery. We discussed initial stabilization and the need for transfer to NICU for further care. We discussed the need for IV fluids via central line for nutritional support and hydration. We discussed the possibility of silo placement with gradual reduction and eventual surgery vs primary closure, depending on the amount of bowel that is exposed and the stability of her baby. We discussed that her baby will require antibiotics initially to treat or prevent possible infection.  She inquired about the mode of delivery and I explained that barring any other complications, and she and baby are well she should be able to delivery vaginally. There is the possibility of cesarean delivery if any issues arise, and I explained that this would be a decision made by her care OB team/providers. She inquired about the delivery hospital, and we discussed that as of now, she will be able to deliver at [facility], but that she will meet with the Pediatric Surgeon to hear more details about the surgery and the determination for whether she will deliver here.   We discussed her intended mode of feeding, which is bottle/formula feeding  for now, though she seemed unsure. I discussed the benefits of maternal milk, particularly in the setting of gastroschisis. She seemed open to providing milk. We discussed the likely need for gavage supplementation in addition to oral feedings, that feedings will be gradually increased over time to ensure tolerance, and that some babies will take prolonged periods of time to tolerate feedings. We discussed an estimated LOS of ~1 month, but that it will depend how baby does.   Mother has not yet chosen a pediatrician and we discussed the importance of this. She has a list for pediatricians in [place] and will work on choosing one.  I answered mother's questions.   Thank you for involving [place] in the care of this patient. The Neonatology team is available should additional questions or concerns arise.   Time for consultation: 30 minutes of face-to-face counseling re: gastroschisis and expected post-natal course and NICU care.   Jacob Moores, MD Attending Neonatologist

## 2021-07-07 NOTE — Progress Notes (Signed)
Referral for transfer to [facility] placed due to need to deliver at [facility]

## 2021-07-13 ENCOUNTER — Other Ambulatory Visit: Payer: Self-pay

## 2021-07-13 ENCOUNTER — Ambulatory Visit: Payer: Medicaid Other | Attending: Obstetrics

## 2021-07-13 ENCOUNTER — Ambulatory Visit: Payer: Medicaid Other | Admitting: *Deleted

## 2021-07-13 ENCOUNTER — Ambulatory Visit (HOSPITAL_BASED_OUTPATIENT_CLINIC_OR_DEPARTMENT_OTHER): Payer: Medicaid Other | Admitting: Obstetrics

## 2021-07-13 VITALS — BP 113/76 | HR 105

## 2021-07-13 DIAGNOSIS — O365930 Maternal care for other known or suspected poor fetal growth, third trimester, not applicable or unspecified: Secondary | ICD-10-CM | POA: Insufficient documentation

## 2021-07-13 DIAGNOSIS — O35DXX0 Maternal care for other (suspected) fetal abnormality and damage, fetal gastrointestinal anomalies, not applicable or unspecified: Secondary | ICD-10-CM | POA: Insufficient documentation

## 2021-07-13 DIAGNOSIS — R772 Abnormality of alphafetoprotein: Secondary | ICD-10-CM | POA: Diagnosis not present

## 2021-07-13 DIAGNOSIS — O35DXX1 Maternal care for other (suspected) fetal abnormality and damage, fetal gastrointestinal anomalies, fetus 1: Secondary | ICD-10-CM | POA: Diagnosis not present

## 2021-07-13 DIAGNOSIS — O358XX0 Maternal care for other (suspected) fetal abnormality and damage, not applicable or unspecified: Secondary | ICD-10-CM | POA: Insufficient documentation

## 2021-07-13 DIAGNOSIS — Z3A31 31 weeks gestation of pregnancy: Secondary | ICD-10-CM | POA: Diagnosis not present

## 2021-07-13 NOTE — Progress Notes (Signed)
MFM Note  Robin Hawkins was seen for a BPP and umbilical artery Doppler studies due to fetal growth restriction.  She has a fetus with a known gastroschisis.  Her MSAFP was also elevated (7.51 MoM).  The patient had a virtual NICU consult.  She was advised that she may deliver at the [facility] pending a pediatric surgery consultation.  A BPP performed today was 8 out of 8.  There was normal amniotic fluid noted.  Doppler studies of the umbilical arteries performed today showed a normal S/D ratio of 3.75.  There were no signs of absent or reversed end-diastolic flow.  The fetal gastroschisis continues to be noted today.  There is a large dilated loop of bowel noted outside of the abdomen.  The increased risk of bowel atresia or blockage associated with gastroschisis that may be causing the dilated loop of bowel was discussed with the patient.  She was advised that the dilated loop of bowel makes this a complicated case of gastroschisis.    The patient reports that she is scheduled to meet with pediatric surgery in 6 days.  I will email the pediatric surgeon the images of the dilated bowel that was noted today.  We will await his recommendation regarding the most optimal institution for delivery.  Should the pediatric surgeon recommend that she deliver at another institution, I will refer the patient to [facility] as she lives in [place].  The patient will return in 1 week for another BPP.    She stated that all of her questions have been answered today.    All conversations were held with the patient today with the help of a Spanish interpreter.  A total of 20 minutes was spent counseling and coordinating the care for this patient.  Greater than 50% of the time was spent in direct face-to-face contact.

## 2021-07-19 ENCOUNTER — Other Ambulatory Visit: Payer: Self-pay

## 2021-07-19 ENCOUNTER — Ambulatory Visit (INDEPENDENT_AMBULATORY_CARE_PROVIDER_SITE_OTHER): Payer: Medicaid Other | Admitting: Surgery

## 2021-07-19 ENCOUNTER — Encounter (INDEPENDENT_AMBULATORY_CARE_PROVIDER_SITE_OTHER): Payer: Self-pay | Admitting: Surgery

## 2021-07-19 DIAGNOSIS — O35DXX0 Maternal care for other (suspected) fetal abnormality and damage, fetal gastrointestinal anomalies, not applicable or unspecified: Secondary | ICD-10-CM

## 2021-07-19 NOTE — Progress Notes (Signed)
[facility] Pediatric General Surgery    Referring Physician: Noralee Space, MD   Thank you for th referral of Robin Hawkins.  As you know, she is a 26 y.o. G9P1011 woman who is carrying a fetus at approximately [redacted] weeks gestation with a gastroschisis.  I appreciate the chance to see her in prenatal consultation, and we reviewed pertinent surgical issues in reference to prenatal, perinatal, and postnatal issues regarding this fetus.  The patient's history was obtained with the assistance of a professional interpreter in person (Spanish).   As you know, Robin Hawkins is in otherwise good health. There is no family history of any congenital anomaly.  She has had an uncomplicated pregnancy to date and remains on prenatal vitamins. Her social history is as follows:   reports that she has never smoked. She has never been exposed to tobacco smoke. She has never used smokeless tobacco. She reports that she does not currently use alcohol. She reports that she does not use drugs.  She underwent a fetal ultrasound at 31 weeks demonstrating a gastroschisis with extra-abdominal bowel dilation. It does not appear to contain liver.  A fetal echocardiogram was not performed.  In terms of prenatal issues, I reviewed the etiology of gastroschisis.  I reviewed the potential risk for intestinal atresia and intrauterine growth retardation.  At this stage in her gestation, there are no other options for prenatal therapies and I have encouraged her to keep up with her scheduled ultrasounds.  In terms of perinatal issues, I will defer all issues regarding perinatal management to your expertise. However, I informed her that studies demonstrate no difference in neonatal outcome between vaginal and caesarean modes of delivery.   Finally, in terms of postnatal issues, I will be able to assist with evaluation and care of this baby in consultation with the neonatology staff after birth if she were  to deliver at [facility]. The baby will be transferred to the NICU and will undergo a full assortment of supportive measures.  We reviewed surgical repair of the gastroschisis.  The type of repair will depend on the size of the gastroschisis.  We also reviewed the extended amount of time for neonates with gastroschisis to tolerate feeing into the intestines.  Finally, we reviewed survival statistics and the comorbidities associated with this defect.  I informed  Robin Hawkins about the probability of complex gastroschisis due to the findings of the most recent fetal ultrasound. I told her that if subsequent ultrasounds continue to demonstrate the bowel dilation, it may be best to deliver at [facility].  I think Robin Hawkins found this session quite helpful.  It has been a pleasure performing this consultation.  Please do not hesitate to call our office with questions or concerns.  I spent approximately 60 minutes in face-to-face consultation.  Kandice Hams, MD, MHS

## 2021-07-19 NOTE — Patient Instructions (Signed)
En [facility], estamos compromentidos a brindar una atencion excepcional. Recibira una encuesta de satisfaccion po mensaje de texto or correo con respecto a su visita de hoy. Su opinion es importante para mi. Se agradecen los comentarios.  

## 2021-07-20 ENCOUNTER — Ambulatory Visit: Payer: Medicaid Other | Attending: Obstetrics | Admitting: Obstetrics

## 2021-07-20 ENCOUNTER — Ambulatory Visit: Payer: Medicaid Other

## 2021-07-20 ENCOUNTER — Ambulatory Visit: Payer: Medicaid Other | Attending: Obstetrics

## 2021-07-20 ENCOUNTER — Ambulatory Visit: Payer: Medicaid Other | Admitting: *Deleted

## 2021-07-20 VITALS — BP 115/70 | HR 89

## 2021-07-20 DIAGNOSIS — O365930 Maternal care for other known or suspected poor fetal growth, third trimester, not applicable or unspecified: Secondary | ICD-10-CM | POA: Diagnosis present

## 2021-07-20 DIAGNOSIS — O365931 Maternal care for other known or suspected poor fetal growth, third trimester, fetus 1: Secondary | ICD-10-CM

## 2021-07-20 DIAGNOSIS — O35DXX0 Maternal care for other (suspected) fetal abnormality and damage, fetal gastrointestinal anomalies, not applicable or unspecified: Secondary | ICD-10-CM

## 2021-07-20 DIAGNOSIS — Z3A32 32 weeks gestation of pregnancy: Secondary | ICD-10-CM

## 2021-07-20 DIAGNOSIS — R772 Abnormality of alphafetoprotein: Secondary | ICD-10-CM | POA: Diagnosis present

## 2021-07-20 NOTE — Progress Notes (Signed)
MFM Note  Robin Hawkins was seen for a BPP due to IUGR and fetus with gastroschisis.  She denies any problems since her last exam and reports feeling fetal movements throughout the day.    She had a consultation with pediatric surgery yesterday.  The pediatric surgeon recommended that should this be a more complex case of gastroschisis, i.e. the dilated loop of bowel continues to be noted outside the fetal abdomen, that she should probably deliver at [facility].    A biophysical profile performed today was 8 out of 8.    There was normal amniotic fluid noted on today's ultrasound exam.  Doppler studies of the umbilical arteries performed today showed a normal S/D ratio of 2.97.  There were no signs of absent or reversed end-diastolic flow.  A dilated loop of intestine outside of the fetal abdomen measuring 1.5 cm dilated continues to be noted on today's exam.  The fetal stomach remains within the abdominal cavity. The patient and her mother were advised that this may be a more complex case of gastroschisis.  I will present her case at the next [facility] meeting.  They understand that there is a high probability that the group will recommend that she deliver her baby at [facility] so that her baby can get the appropriate surgical care after birth.  They understand that the baby will have to be examined after birth to determine if there are strictures or intestinal atresia present.  They understand that if there are portions of the bowel that are determined to be necrotic, that her baby may require a bowel resection and a prolonged NICU stay after birth.  She will return in 1 week for another BPP and umbilical artery Doppler study.  We will initiate the transfer of her care to [facility] next week should the group decide that she should should deliver there.    All conversations were held with the patient today with the help of a Spanish interpreter.    The patient and her mother stated that all  of their questions have been answered.  A total of 20 minutes was spent counseling and coordinating the care for this patient.  Greater than 50% of the time was spent in direct face-to-face contact.

## 2021-07-27 ENCOUNTER — Encounter: Payer: Self-pay | Admitting: *Deleted

## 2021-07-27 ENCOUNTER — Ambulatory Visit: Payer: Medicaid Other | Attending: Obstetrics

## 2021-07-27 ENCOUNTER — Other Ambulatory Visit: Payer: Self-pay

## 2021-07-27 ENCOUNTER — Ambulatory Visit: Payer: Medicaid Other | Admitting: *Deleted

## 2021-07-27 VITALS — BP 111/65 | HR 88

## 2021-07-27 DIAGNOSIS — O35DXX0 Maternal care for other (suspected) fetal abnormality and damage, fetal gastrointestinal anomalies, not applicable or unspecified: Secondary | ICD-10-CM | POA: Diagnosis not present

## 2021-07-27 DIAGNOSIS — R772 Abnormality of alphafetoprotein: Secondary | ICD-10-CM | POA: Insufficient documentation

## 2021-07-27 DIAGNOSIS — Z3A33 33 weeks gestation of pregnancy: Secondary | ICD-10-CM | POA: Diagnosis not present

## 2021-07-27 DIAGNOSIS — O365930 Maternal care for other known or suspected poor fetal growth, third trimester, not applicable or unspecified: Secondary | ICD-10-CM | POA: Insufficient documentation

## 2021-08-01 ENCOUNTER — Encounter: Payer: Self-pay | Admitting: Obstetrics and Gynecology

## 2021-08-03 ENCOUNTER — Other Ambulatory Visit: Payer: Self-pay

## 2021-08-03 ENCOUNTER — Ambulatory Visit: Payer: Medicaid Other | Attending: Obstetrics and Gynecology

## 2021-08-03 ENCOUNTER — Ambulatory Visit: Payer: Medicaid Other | Admitting: *Deleted

## 2021-08-03 VITALS — BP 111/67 | HR 96

## 2021-08-03 DIAGNOSIS — O365930 Maternal care for other known or suspected poor fetal growth, third trimester, not applicable or unspecified: Secondary | ICD-10-CM

## 2021-08-03 DIAGNOSIS — O35DXX0 Maternal care for other (suspected) fetal abnormality and damage, fetal gastrointestinal anomalies, not applicable or unspecified: Secondary | ICD-10-CM | POA: Insufficient documentation

## 2021-08-03 DIAGNOSIS — Z3A34 34 weeks gestation of pregnancy: Secondary | ICD-10-CM | POA: Diagnosis not present

## 2021-08-03 DIAGNOSIS — O289 Unspecified abnormal findings on antenatal screening of mother: Secondary | ICD-10-CM

## 2021-08-04 ENCOUNTER — Ambulatory Visit: Payer: Self-pay

## 2021-08-10 ENCOUNTER — Ambulatory Visit: Payer: Medicaid Other | Attending: Obstetrics and Gynecology

## 2021-08-10 ENCOUNTER — Other Ambulatory Visit: Payer: Self-pay | Admitting: *Deleted

## 2021-08-10 ENCOUNTER — Ambulatory Visit: Payer: Medicaid Other | Admitting: *Deleted

## 2021-08-10 ENCOUNTER — Encounter: Payer: Self-pay | Admitting: *Deleted

## 2021-08-10 ENCOUNTER — Other Ambulatory Visit: Payer: Self-pay

## 2021-08-10 VITALS — BP 115/68 | HR 98

## 2021-08-10 DIAGNOSIS — O365930 Maternal care for other known or suspected poor fetal growth, third trimester, not applicable or unspecified: Secondary | ICD-10-CM | POA: Diagnosis not present

## 2021-08-10 DIAGNOSIS — O281 Abnormal biochemical finding on antenatal screening of mother: Secondary | ICD-10-CM | POA: Diagnosis not present

## 2021-08-10 DIAGNOSIS — O35DXX0 Maternal care for other (suspected) fetal abnormality and damage, fetal gastrointestinal anomalies, not applicable or unspecified: Secondary | ICD-10-CM | POA: Diagnosis not present

## 2021-08-10 DIAGNOSIS — Z3A35 35 weeks gestation of pregnancy: Secondary | ICD-10-CM | POA: Diagnosis not present

## 2021-08-10 NOTE — Progress Notes (Unsigned)
u

## 2021-08-17 ENCOUNTER — Ambulatory Visit: Payer: Medicaid Other | Attending: Maternal & Fetal Medicine

## 2021-08-17 ENCOUNTER — Ambulatory Visit: Payer: Medicaid Other

## 2021-08-22 ENCOUNTER — Encounter: Payer: Self-pay | Admitting: Obstetrics and Gynecology

## 2021-09-16 ENCOUNTER — Encounter: Payer: Self-pay | Admitting: Family Medicine

## 2021-09-16 NOTE — Progress Notes (Unsigned)
Patient ID: Robin Hawkins, female   DOB: [birth year], 26 y.o.   MRN: [id number] ? ?[facility] Department Maternity Care Conference ? ?Maternity Care Conference Date: 09/16/21 ? ?Robin Hawkins was identified by clinical staff to benefit from an interdisciplinary team approach to help improve pregnancy care.  The [facility] Maternity Care Conference includes the maternity clinic coordinator (RN), medical providers (MD/APP staff), Care Management -OBCM and Healthy Beginnings, Centering Pregnancy coordinator, Infant Mortality reduction Dietitian.  Nursing staff are also encouraged to participate. The group meets monthly to discuss patient care and coordinate services.  ? ?The patient's care care at the agency was reviewed in EMR and high risk factors evaluated in an interdisciplinary approach.   ? ?Value added interventions discussed at this care conference today were: ? ? ?[facility] Meeting 09/16/21 ?Patient delivered at [facility]  ?MH Nurse to set up PP appointment  ?Robin Hawkins  ?

## 2021-12-29 ENCOUNTER — Ambulatory Visit: Payer: Medicaid Other

## 2022-01-31 ENCOUNTER — Ambulatory Visit: Payer: Medicaid Other

## 2022-02-13 ENCOUNTER — Encounter: Payer: Self-pay | Admitting: Nurse Practitioner

## 2022-02-13 ENCOUNTER — Ambulatory Visit (LOCAL_COMMUNITY_HEALTH_CENTER): Payer: Medicaid Other | Admitting: Nurse Practitioner

## 2022-02-13 VITALS — BP 113/74 | Wt 141.8 lb

## 2022-02-13 DIAGNOSIS — Z0000 Encounter for general adult medical examination without abnormal findings: Secondary | ICD-10-CM | POA: Diagnosis not present

## 2022-02-13 DIAGNOSIS — Z3009 Encounter for other general counseling and advice on contraception: Secondary | ICD-10-CM

## 2022-02-13 DIAGNOSIS — Z309 Encounter for contraceptive management, unspecified: Secondary | ICD-10-CM | POA: Diagnosis not present

## 2022-02-13 NOTE — Progress Notes (Signed)
Pt here to discuss Nexplanon.  PP visit and Nexplanon insertion was done 09/06/2021 @ [facility].  Pt declined STI screening.  FP packet given.  Berdie Ogren, RN

## 2022-02-13 NOTE — Patient Instructions (Addendum)
Ibuprofen 800 MG three times a day for 5 days

## 2022-02-13 NOTE — Progress Notes (Signed)
[facility] problem [facility]  [facility]  Subjective:  Robin Hawkins is a 26 y.o. being seen today to discuss problems with birth control method.   Chief Complaint  Patient presents with   Contraception    PE and Nexplanon check    HPI   Does the patient have a current or past history of drug use? No   No components found for: "HCV"]   Health Maintenance Due  Topic Date Due   HPV VACCINES (1 - 2-dose series) Never done   INFLUENZA VACCINE  01/03/2022    Review of Systems  Constitutional:  Negative for chills, fever, malaise/fatigue and weight loss.       Heat and cold intolerance  HENT:  Negative for congestion, hearing loss and sore throat.   Eyes:  Negative for blurred vision, double vision and photophobia.  Respiratory:  Negative for shortness of breath.   Cardiovascular:  Negative for chest pain.  Gastrointestinal:  Negative for abdominal pain, blood in stool, constipation, diarrhea, heartburn, nausea and vomiting.  Genitourinary:  Negative for dysuria and frequency.       Irregular bleeding   Musculoskeletal:  Negative for back pain, joint pain and neck pain.  Skin:  Negative for itching and rash.  Neurological:  Positive for headaches. Negative for dizziness and weakness.  Endo/Heme/Allergies:  Does not bruise/bleed easily.  Psychiatric/Behavioral:  Negative for depression, substance abuse and suicidal ideas.     The following portions of the patient's history were reviewed and updated as appropriate: allergies, current medications, past family history, past medical history, past social history, past surgical history and problem list. Problem list updated.   See flowsheet for other program required questions.  Objective:   Vitals:   02/13/22 1045  BP: 113/74  Weight: 141 lb 12.8 oz (64.3 kg)    Physical Exam Constitutional:      Appearance: Normal appearance.  HENT:     Head: Normocephalic. No abrasion, masses  or laceration. Hair is normal.     Jaw: No tenderness or swelling.     Right Ear: External ear normal.     Left Ear: External ear normal.     Nose: Nose normal.     Mouth/Throat:     Lips: Pink. No lesions.     Mouth: Mucous membranes are moist. No lacerations or oral lesions.     Dentition: No dental caries.     Tongue: No lesions.     Palate: No mass and lesions.     Pharynx: No pharyngeal swelling, oropharyngeal exudate, posterior oropharyngeal erythema or uvula swelling.     Tonsils: No tonsillar exudate or tonsillar abscesses.     Comments: No visible signs of dental caries  Cardiovascular:     Rate and Rhythm: Normal rate and regular rhythm.  Pulmonary:     Effort: Pulmonary effort is normal.     Breath sounds: Normal breath sounds.  Abdominal:     General: Abdomen is flat. Bowel sounds are normal.     Palpations: Abdomen is soft.     Tenderness: There is no abdominal tenderness. There is no rebound.  Musculoskeletal:     Cervical back: Full passive range of motion without pain and normal range of motion.  Skin:    General: Skin is warm and dry.     Findings: No erythema, laceration, lesion or rash.  Neurological:     Mental Status: She is alert and oriented to person, place, and time.  Psychiatric:        Attention and Perception: Attention normal.        Mood and Affect: Mood normal.        Speech: Speech normal.        Behavior: Behavior normal. Behavior is cooperative.       Assessment and Plan:  Robin Hawkins is a 26 y.o. female presenting to the [facility] Department for a Women's Health problem visit  1. Family planning counseling -26 year old female in clinic today to discuss issues with her birth control method.  Patient had a postpartum visit 09/16/21 with [facility] (seen in [facility]). At that visit patient had a Nexplanon placed.  Patient reports some irregular bleeding.  Advised that some irregular bleeding is normal postpartum and especially  having a new method in place. Patient reports that as of right now the bleeding has stopped.  Informed that if bleeding becomes worse to take Ibuprofen 800 MG PO TID for 5 days.  Patient is to report back to clinic if no improvement.   -ROS reviewed, patient reports heat and cold intolerance, headaches, and irregular bleeding.  Headaches are managed through administration of Tylenol and rest.  Advised to continue with regimen.  Patient also given PCP list to address any additional questions or concerns.   -Patient to continue with method.   2. Well woman exam (no gynecological exam) -CBE due at next RP visit -PAP due 01/2023  Total time spent: 20 minutes   Return in about 7 months (around 09/17/2022) for Annual well-woman exam.   Glenna Fellows, FNP

## 2022-06-30 ENCOUNTER — Ambulatory Visit: Payer: Medicaid Other

## 2022-07-06 ENCOUNTER — Encounter (INDEPENDENT_AMBULATORY_CARE_PROVIDER_SITE_OTHER): Payer: Self-pay

## 2022-07-25 ENCOUNTER — Ambulatory Visit (LOCAL_COMMUNITY_HEALTH_CENTER): Payer: Medicaid Other | Admitting: Family Medicine

## 2022-07-25 ENCOUNTER — Ambulatory Visit: Payer: Medicaid Other | Admitting: Family Medicine

## 2022-07-25 VITALS — BP 112/69 | HR 83 | Ht 63.0 in | Wt 141.0 lb

## 2022-07-25 DIAGNOSIS — Z8759 Personal history of other complications of pregnancy, childbirth and the puerperium: Secondary | ICD-10-CM | POA: Insufficient documentation

## 2022-07-25 DIAGNOSIS — Z3046 Encounter for surveillance of implantable subdermal contraceptive: Secondary | ICD-10-CM | POA: Diagnosis not present

## 2022-07-25 DIAGNOSIS — Z309 Encounter for contraceptive management, unspecified: Secondary | ICD-10-CM | POA: Diagnosis not present

## 2022-07-25 DIAGNOSIS — Z30013 Encounter for initial prescription of injectable contraceptive: Secondary | ICD-10-CM

## 2022-07-25 DIAGNOSIS — Z3009 Encounter for other general counseling and advice on contraception: Secondary | ICD-10-CM

## 2022-07-25 MED ORDER — MEDROXYPROGESTERONE ACETATE 150 MG/ML IM SUSP
150.0000 mg | INTRAMUSCULAR | Status: AC
  Administered 2022-07-25 – 2022-10-10 (×2): 150 mg via INTRAMUSCULAR

## 2022-07-25 NOTE — Progress Notes (Addendum)
[place] problem visit  [place] Department  Subjective:  Leva Kaminski is a 27 y.o. being seen today for   Chief Complaint  Patient presents with   Contraception    Nexplanon removal IUD insertion     HPI   Does the patient have a current or past history of drug use? No   No components found for: "HCV"]   Health Maintenance Due  Topic Date Due   COVID-19 Vaccine (1) Never done   HPV VACCINES (1 - 2-dose series) Never done   INFLUENZA VACCINE  01/03/2022    ROS  The following portions of the patient's history were reviewed and updated as appropriate: allergies, current medications, past family history, past medical history, past social history, past surgical history and problem list. Problem list updated.  Continuous vaginal bleeding x 3 months  See flowsheet for other program required questions.  Objective:   Vitals:   07/25/22 1533  BP: 112/69  Pulse: 83  Weight: 141 lb (64 kg)  Height: 5' 3"$  (1.6 m)    Physical Exam  deferred  Assessment and Plan:  Monita Makin is a 27 y.o. female presenting to the [facility] Department for a Women's Health problem visit  1. Encounter for Nexplanon removal Nexplanon Removal Patient identified, informed consent performed, consent signed.   Appropriate time out taken. Nexplanon site identified.  Area prepped in usual sterile fashon. 3 ml of 1% lidocaine with Epinephrine was used to anesthetize the area at the distal end of the implant and along implant site. A small stab incision was made right beside the implant on the distal portion.  The Nexplanon rod was grasped using curved hemostats/manual and removed without difficulty.  There was minimal blood loss. There were no complications.  Steri-strips were applied over the small incision.  A pressure bandage was applied to reduce any bruising.  The patient tolerated the procedure well and was given post procedure instructions.      Nexplanon:   Counseled patient to take OTC analgesic starting as soon as lidocaine starts to wear off and take regularly for at least 48 hr to decrease discomfort.  Specifically to take with food or milk to decrease stomach upset and for IB 600 mg (3 tablets) every 6 hrs; IB 800 mg (4 tablets) every 8 hrs; or Aleve 2 tablets every 12 hrs.   2. Family planning Long discussion with patient regarding her nexplanon and desire for IUD. Pt states that the reason she wants her nexplanon removed is due to continuous bleeding which started in Sept 2023. She used the ibuprofen treatment, and it improved her period but "not much". She states she wants the nexplanon out and a paraguard IUD. Reviewed that I do not recommend this as it is the IUD that is most likely to cause bleeding. She said her mom and sister have the paraguard which is why she wants it. I encouraged her to get a hormone IUD. Pt could not decide today on which IUD to place. Pt decided she would like the depo injection and will think it over.  - medroxyPROGESTERone (DEPO-PROVERA) injection 150 mg     Return in about 3 months (around 10/23/2022) for depo.  Due to language barrier, interpreter Marlene [place] was present for this visit.   Sharlet Salina, [place]

## 2022-07-25 NOTE — Progress Notes (Signed)
Pt here for Nexplanon removal and IUD insertion.  Nexplanon removed by Art Buff, FNP without complications.  Pt is not sure which type of IUD she wants at this time.  After further counseling with FNP Lowella Petties, pt decides to use Depo for next 3 months until decides on IUD type.  Depo Provera 114m IM given in R deltoid without complications.  Pt counseled to use condoms as back up method x 7 days.  Verbalizes understanding.  Condoms provided.-JForest Becker RN

## 2022-10-10 ENCOUNTER — Ambulatory Visit: Payer: Medicaid Other

## 2022-10-10 ENCOUNTER — Ambulatory Visit (LOCAL_COMMUNITY_HEALTH_CENTER): Payer: Medicaid Other

## 2022-10-10 VITALS — BP 113/73 | Ht 63.0 in | Wt 141.0 lb

## 2022-10-10 DIAGNOSIS — Z3042 Encounter for surveillance of injectable contraceptive: Secondary | ICD-10-CM | POA: Diagnosis not present

## 2022-10-10 DIAGNOSIS — Z309 Encounter for contraceptive management, unspecified: Secondary | ICD-10-CM

## 2022-10-10 DIAGNOSIS — Z3009 Encounter for other general counseling and advice on contraception: Secondary | ICD-10-CM

## 2022-10-10 NOTE — Addendum Note (Signed)
Addended by: Regenia Skeeter on: 10/10/2022 11:42 AM   Modules accepted: Level of Service

## 2022-10-10 NOTE — Progress Notes (Addendum)
11 weeks post hormonal injection.  Patient voiced no concerns or complaints with hormonal injection. Patient reported she has been having yellow vaginal discharge the last 3 weeks and wants STI testing - appointment made to be seen with Women's clinic. Medroxyprogesterone Acetate 150 mg given per order dated 02/20/204 x 1 year by [facility] FNP.  Given IM left deltoid. Tolerated well. Reminder card provided to call for next appointment for injection due 12/26/2022. Lang line [phone] Mir[place] used as interpreter.

## 2022-10-12 ENCOUNTER — Ambulatory Visit: Payer: Medicaid Other

## 2022-10-27 ENCOUNTER — Ambulatory Visit: Payer: Medicaid Other

## 2023-01-01 ENCOUNTER — Ambulatory Visit: Payer: Medicaid Other

## 2023-01-19 ENCOUNTER — Ambulatory Visit: Payer: Medicaid Other

## 2023-02-06 ENCOUNTER — Ambulatory Visit (LOCAL_COMMUNITY_HEALTH_CENTER): Payer: Medicaid Other | Admitting: Family Medicine

## 2023-02-06 VITALS — BP 124/78 | HR 74 | Ht 63.0 in | Wt 144.0 lb

## 2023-02-06 DIAGNOSIS — Z975 Presence of (intrauterine) contraceptive device: Secondary | ICD-10-CM | POA: Insufficient documentation

## 2023-02-06 DIAGNOSIS — Z30430 Encounter for insertion of intrauterine contraceptive device: Secondary | ICD-10-CM

## 2023-02-06 DIAGNOSIS — Z309 Encounter for contraceptive management, unspecified: Secondary | ICD-10-CM

## 2023-02-06 MED ORDER — PARAGARD INTRAUTERINE COPPER IU IUD
1.0000 | INTRAUTERINE_SYSTEM | Freq: Once | INTRAUTERINE | Status: AC
  Administered 2023-02-06: 1 via INTRAUTERINE

## 2023-02-06 NOTE — Progress Notes (Signed)
Pt here for paragard insert, condoms declined. Gaspar Garbe, RN

## 2023-02-06 NOTE — Progress Notes (Signed)
[facility] Problem Visit  [facility]  Subjective:  Robin Hawkins is a 27 y.o. being seen today for   Chief Complaint  Patient presents with   Contraception    HPI  Pt is here today for IUD placement   Health Maintenance Due  Topic Date Due   HPV VACCINES (1 - 3-dose series) Never done   INFLUENZA VACCINE  01/04/2023   COVID-19 Vaccine (1 - 2023-24 season) Never done   PAP-Cervical Cytology Screening  01/26/2023   PAP SMEAR-Modifier  01/26/2023    ROS  The following portions of the patient's history were reviewed and updated as appropriate: allergies, current medications, past family history, past medical history, past social history, past surgical history and problem list. Problem list updated.   See flowsheet for other program required questions.  Objective:   Vitals:   02/06/23 1356  BP: 124/78  Pulse: 74  Weight: 144 lb (65.3 kg)  Height: 5\' 3"  (1.6 m)    Physical Exam Vitals and nursing note reviewed. Exam conducted with a chaperone present.  Constitutional:      Appearance: Normal appearance.  HENT:     Head: Normocephalic and atraumatic.     Mouth/Throat:     Mouth: Mucous membranes are moist.     Pharynx: Oropharynx is clear. No oropharyngeal exudate or posterior oropharyngeal erythema.  Pulmonary:     Effort: Pulmonary effort is normal.  Abdominal:     General: Abdomen is flat.     Palpations: There is no mass.     Tenderness: There is no abdominal tenderness. There is no rebound.  Genitourinary:    General: Normal vulva.     Exam position: Lithotomy position.     Pubic Area: No rash or pubic lice.      Labia:        Right: No rash or lesion.        Left: No rash or lesion.      Vagina: Vaginal discharge present. No erythema, bleeding or lesions.     Cervix: No cervical motion tenderness, discharge, friability, lesion or erythema.     Uterus: Normal.      Adnexa: Right adnexa normal and left  adnexa normal.     Rectum: Normal.     Comments: pH = 4   Lymphadenopathy:     Head:     Right side of head: No preauricular or posterior auricular adenopathy.     Left side of head: No preauricular or posterior auricular adenopathy.     Cervical: No cervical adenopathy.     Upper Body:     Right upper body: No supraclavicular, axillary or epitrochlear adenopathy.     Left upper body: No supraclavicular, axillary or epitrochlear adenopathy.     Lower Body: No right inguinal adenopathy. No left inguinal adenopathy.  Skin:    General: Skin is warm and dry.     Findings: No rash.  Neurological:     Mental Status: She is alert and oriented to person, place, and time.       Assessment and Plan:  Robin Hawkins is a 27 y.o. female presenting to the [facility] Department for a Women's Health problem visit  1. Encounter for IUD insertion    Patient presented to [facility] for IUD insertion. Her GC/CT screening was found to be up to date and using WHO criteria we can be reasonably certain she is not pregnant or a pregnancy test  was obtained which was Urine pregnancy test  today was not indicated- currently on menses.  See Flowsheet for IUD check list  IUD Insertion Procedure Note Patient identified, informed consent performed, consent signed.   Discussed risks of irregular bleeding, cramping, infection, malpositioning or misplacement of the IUD outside the uterus which may require further procedure such as laparoscopy. Time out was performed.    Speculum placed in the vagina.  Cervix visualized.  Cleaned with Betadine x 2.  Grasped anteriorly with a single tooth tenaculum.  Uterus sounded to 7 cm.  IUD placed per manufacturer's recommendations.  Strings trimmed to 3 cm. Tenaculum was removed, good hemostasis noted.  Patient tolerated procedure well.   Patient was given post-procedure instructions- both agency handout and verbally by provider.  She was advised to have backup  contraception for one week.  Patient was also asked to check IUD strings periodically or follow up in 4 weeks for IUD check.   - paragard intrauterine copper IUD 1 each - Chlamydia/Gonorrhea  Lab     Return in about 4 weeks (around 03/06/2023) for IUD string check, annual well-woman exam.  Future Appointments  Date Time Provider Department Center  03/06/2023 10:00 AM [facility] PROVIDER [facility] None  Due to language barrier, interpreter Estill Dooms was present for this visit.   Lenice Llamas, [place]

## 2023-03-06 ENCOUNTER — Ambulatory Visit: Payer: Medicaid Other

## 2023-04-06 ENCOUNTER — Emergency Department: Payer: Medicaid Other

## 2023-04-06 ENCOUNTER — Emergency Department
Admission: EM | Admit: 2023-04-06 | Discharge: 2023-04-06 | Disposition: A | Discharge status: Home / Self Care | Payer: Medicaid Other | Attending: Emergency Medicine | Admitting: Emergency Medicine

## 2023-04-06 ENCOUNTER — Other Ambulatory Visit: Payer: Self-pay

## 2023-04-06 DIAGNOSIS — M79602 Pain in left arm: Secondary | ICD-10-CM | POA: Diagnosis present

## 2023-04-06 DIAGNOSIS — G5622 Lesion of ulnar nerve, left upper limb: Secondary | ICD-10-CM | POA: Insufficient documentation

## 2023-04-06 LAB — BASIC METABOLIC PANEL
Anion gap: 10 (ref 5–15)
BUN: 21 mg/dL — ABNORMAL HIGH (ref 6–20)
CO2: 19 mmol/L — ABNORMAL LOW (ref 22–32)
Calcium: 9.1 mg/dL (ref 8.9–10.3)
Chloride: 108 mmol/L (ref 98–111)
Creatinine, Ser: 0.58 mg/dL (ref 0.44–1.00)
GFR, Estimated: >60 mL/min (ref >60)
Glucose, Bld: 89 mg/dL (ref 70–99)
Potassium: 3.7 mmol/L (ref 3.5–5.1)
Sodium: 137 mmol/L (ref 135–145)

## 2023-04-06 LAB — HCG, QUANTITATIVE, PREGNANCY: hCG, Beta Chain, Quant, S: <1 m[IU]/mL (ref <5)

## 2023-04-06 LAB — CBC
HCT: 40.1 % (ref 36.0–46.0)
Hemoglobin: 13.6 g/dL (ref 12.0–15.0)
MCH: 31.5 pg (ref 26.0–34.0)
MCHC: 33.9 g/dL (ref 30.0–36.0)
MCV: 92.8 fL (ref 80.0–100.0)
Platelets: 217 10*3/uL (ref 150–400)
RBC: 4.32 MIL/uL (ref 3.87–5.11)
RDW: 11.3 % — ABNORMAL LOW (ref 11.5–15.5)
WBC: 14.5 10*3/uL — ABNORMAL HIGH (ref 4.0–10.5)
nRBC: 0 % (ref 0.0–0.2)

## 2023-04-06 LAB — TROPONIN I (HIGH SENSITIVITY): Troponin I (High Sensitivity): <2 ng/L (ref <18)

## 2023-04-06 MED ORDER — NAPROXEN 500 MG PO TBEC: 500.0000 mg | DELAYED_RELEASE_TABLET | Freq: Two times a day (BID) | ORAL | 0 refills | Status: AC

## 2023-04-06 MED ORDER — KETOROLAC TROMETHAMINE 30 MG/ML IJ SOLN
30.0000 mg | Freq: Once | INTRAMUSCULAR | Status: AC
  Administered 2023-04-06: 30 mg via INTRAMUSCULAR
  Filled 2023-04-06: qty 1

## 2023-04-06 NOTE — ED Provider Notes (Signed)
[facility] Provider Note    Event Date/Time   First MD Initiated Contact with Patient 04/06/23 1816     (approximate)   History   Chief Complaint left arm pain   HPI  Robin Hawkins is a 27 y.o. female with no significant past medical history who presents to the ED complaining of left arm pain.  History is limited as patient is Spanish-speaking only, history obtained via interpreter [phone].  Patient reports that she has been dealing with pain along her left forearm and into her left wrist since about 10:00 this morning.  She states that it initially started as a tingling discomfort in her left hand, has since moved up through the ulnar portion of her wrist and forearm.  She also has pain shooting up into her chest and back, denies any pain radiating downwards.  She has not had any fever or cough and denies any difficulty breathing.  She has not take anything for pain prior to arrival.  She states it is painful for her to move her wrist and her hand, making it difficult for her to fully open her hand.     Physical Exam   Triage Vital Signs: ED Triage Vitals  Encounter Vitals Group     BP 04/06/23 1528 122/79     Systolic BP Percentile --      Diastolic BP Percentile --      Pulse Rate 04/06/23 1528 77     Resp 04/06/23 1528 20     Temp 04/06/23 1528 97.7 F (36.5 C)     Temp Source 04/06/23 1528 Oral     SpO2 04/06/23 1528 100 %     Weight 04/06/23 1529 137 lb (62.1 kg)     Height 04/06/23 1529 5\' 3"  (1.6 m)     Head Circumference --      Peak Flow --      Pain Score 04/06/23 1532 10     Pain Loc --      Pain Education --      Exclude from Growth Chart --     Most recent vital signs: Vitals:   04/06/23 1930 04/06/23 1938  BP: 122/84 122/84  Pulse:  70  Resp:  15  Temp:    SpO2:  99%    Constitutional: Alert and oriented. Eyes: Conjunctivae are normal. Head: Atraumatic. Nose: No congestion/rhinnorhea. Mouth/Throat: Mucous  membranes are moist.  Neck: No midline cervical spine tenderness to palpation. Cardiovascular: Normal rate, regular rhythm. Grossly normal heart sounds.  2+ radial pulses bilaterally.  Cap refill less than 2 seconds throughout digits of left hand. Respiratory: Normal respiratory effort.  No retractions. Lungs CTAB.  Left chest wall tenderness to palpation noted. Gastrointestinal: Soft and nontender. No distention. Musculoskeletal: No lower extremity tenderness nor edema. Tenderness over ulnar portion of left forearm as well as ulnar portion of left wrist with no obvious deformity.  No erythema, edema, or warmth noted.  Pain noted upon range of motion of left wrist. Neurologic:  Normal speech and language.  5 out of 5 strength with shoulder abduction as well as elbow flexion and extension bilaterally.  Strength exam in left wrist as well as left grip is limited secondary to pain.      ED Results / Procedures / Treatments   Labs (all labs ordered are listed, but only abnormal results are displayed) Labs Reviewed  BASIC METABOLIC PANEL - Abnormal; Notable for the following components:  Result Value   CO2 19 (*)    BUN 21 (*)    All other components within normal limits  CBC - Abnormal; Notable for the following components:   WBC 14.5 (*)    RDW 11.3 (*)    All other components within normal limits  HCG, QUANTITATIVE, PREGNANCY  TROPONIN I (HIGH SENSITIVITY)     EKG  ED ECG REPORT I, Chesley Noon, the attending physician, personally viewed and interpreted this ECG.   Date: 04/06/2023  EKG Time: 15:26  Rate: 92  Rhythm: normal sinus rhythm  Axis: Normal  Intervals:none  ST&T Change: None  RADIOLOGY Chest x-ray reviewed and interpreted by me with no infiltrate, edema, or effusion.  PROCEDURES:  Critical Care performed: No  Procedures   MEDICATIONS ORDERED IN ED: Medications  ketorolac (TORADOL) 30 MG/ML injection 30 mg (30 mg Intramuscular Given 04/06/23 1916)      IMPRESSION / MDM / ASSESSMENT AND PLAN / ED COURSE  I reviewed the triage vital signs and the nursing notes.                              27 y.o. female with no significant past medical history who presents to the ED complaining of pain over ulnar portion of her left forearm as well as left wrist and hand with some tingling in her left hand since 10:00 this morning.  Patient's presentation is most consistent with acute presentation with potential threat to life or bodily function.  Differential diagnosis includes, but is not limited to, arterial insufficiency, DVT, cellulitis, cervical radiculopathy, peripheral neuropathy, arthritis, fracture.  Patient well-appearing and in no acute distress, vital signs are unremarkable.  Patient complains of pain starting in her arm and moving up into her chest, cardiac workup has been unremarkable with reassuring EKG and troponin within normal limits.  I doubt ACS or PE, chest x-ray also are unremarkable.  Mild leukocytosis noted with no significant anemia, electrolyte abnormality, or AKI.  Her pain primarily seems to be with range of motion of the wrist as well as palpation of the ulnar portion of her forearm.  We will check left wrist x-ray, no evidence of infection or arterial insufficiency on exam.  If imaging is unremarkable, suspect ulnar neuropathy and we will treat symptomatically with IM Toradol.  Left wrist x-ray unremarkable by my read, patient reports pain improving following IM Toradol and her range of motion is also improving at this time.  Patient appropriate for outpatient follow-up for ulnar neuropathy, referral provided to orthopedics.  She was counseled to return to the ED for new or worsening symptoms, patient agrees with plan.      FINAL CLINICAL IMPRESSION(S) / ED DIAGNOSES   Final diagnoses:  Ulnar neuropathy of left upper extremity     Rx / DC Orders   ED Discharge Orders          Ordered    naproxen (EC NAPROSYN) 500  MG EC tablet  2 times daily with meals        04/06/23 1950             Note:  This document was prepared using Dragon voice recognition software and may include unintentional dictation errors.   Chesley Noon, MD 04/06/23 [phone]

## 2023-04-06 NOTE — ED Triage Notes (Signed)
Pt states at 10am this morning she developed left arm pain radiating to her chest. Chest currently hurts. States this happened to her right arm a couple years ago and it went away so didn't come to ER. No known injuries. States she can't spread out her left fingers

## 2023-10-31 ENCOUNTER — Ambulatory Visit

## 2023-12-03 ENCOUNTER — Ambulatory Visit: Admitting: Family Medicine

## 2023-12-03 ENCOUNTER — Encounter: Payer: Self-pay | Admitting: Family Medicine

## 2023-12-03 VITALS — BP 109/73 | HR 70 | Ht 63.0 in | Wt 137.0 lb

## 2023-12-03 DIAGNOSIS — N760 Acute vaginitis: Secondary | ICD-10-CM

## 2023-12-03 DIAGNOSIS — Z3009 Encounter for other general counseling and advice on contraception: Secondary | ICD-10-CM

## 2023-12-03 DIAGNOSIS — Z975 Presence of (intrauterine) contraceptive device: Secondary | ICD-10-CM

## 2023-12-03 DIAGNOSIS — Z01419 Encounter for gynecological examination (general) (routine) without abnormal findings: Secondary | ICD-10-CM

## 2023-12-03 DIAGNOSIS — Z113 Encounter for screening for infections with a predominantly sexual mode of transmission: Secondary | ICD-10-CM

## 2023-12-03 DIAGNOSIS — B3731 Acute candidiasis of vulva and vagina: Secondary | ICD-10-CM

## 2023-12-03 DIAGNOSIS — Z309 Encounter for contraceptive management, unspecified: Secondary | ICD-10-CM | POA: Diagnosis not present

## 2023-12-03 LAB — WET PREP FOR TRICH, YEAST, CLUE
Clue Cell Exam: POSITIVE — AB
Trichomonas Exam: NEGATIVE

## 2023-12-03 MED ORDER — CLOTRIMAZOLE 1 % VA CREA: 1.0000 | TOPICAL_CREAM | Freq: Every day | VAGINAL | Status: AC

## 2023-12-03 MED ORDER — METRONIDAZOLE 500 MG PO TABS: 500.0000 mg | ORAL_TABLET | Freq: Two times a day (BID) | ORAL | Status: AC

## 2023-12-03 NOTE — Progress Notes (Signed)
 [facility] [address] [place] [address] Main phone: [phone]  Family Planning Visit - Repeat Yearly Visit  Subjective:  Robin Hawkins is a 28 y.o. [id number]  being seen today for an annual wellness visit and to discuss contraception options. The patient is currently using IUD (or IUS) for pregnancy prevention. Patient does not want a pregnancy in the next year.   Patient reports they are looking for a method with the following characteristics:  High efficacy at preventing pregnancy  Patient has the following medical problems:  Patient Active Problem List   Diagnosis Date Noted   IUD (intrauterine device) in place 02/06/2023   History of prior pregnancy with IUGR newborn 07/25/2022   Chief Complaint  Patient presents with   Annual Exam    HPI Patient reports to clinic for PE and IUD check. Reports 2 weeks fo vaginal discharge with itching  Patient denies other concerns about self    Review of Systems  Constitutional:  Negative for weight loss.  Eyes:  Negative for blurred vision.  Respiratory:  Negative for cough and shortness of breath.   Cardiovascular:  Negative for claudication.  Gastrointestinal:  Negative for nausea.  Genitourinary:  Negative for dysuria and frequency.  Skin:  Negative for rash.  Neurological:  Negative for headaches.  Endo/Heme/Allergies:  Does not bruise/bleed easily.    See flowsheet for further details and programmatic requirements Hyperlink available at the top of the signed note in blue.  Flow sheet content below:  Pregnancy Intention Screening Does the patient want to become pregnant in the next year?: No Does the patient's partner want to become pregnant in the next year?: No Would the patient like to discuss contraceptive options today?: No Results Follow up Password: [email or web address] Is it okay to contact you by mail?: Yes Contraception History Past methods of  contraception used by patient:: Intrauterine device or system (IDU,IUS), Hormonal Implant Adverse effects associated with Intrauterine device or system (IUD,IUS): none Adverse effects associated with Hormonal Implant: irregular bleeding Sexual History What age did you start your period?: 16 How often do you have your period?: monthly Date of last sex?: 11/26/23 Has the patient had unprotected sex within the last 5 days?: No Do you have sex with men, women, both men and women?: Men only In the past 2 months how many partners have you had sex with?: 1 In the past 12 months, how many partners have you had sex with?: 2 Is it possible that any of your sex partners in the past 12 months had sex with someone else whild they were still in a sexual relationship with you?: Yes What ways do you have sex?: Vaginal Do you or your partner use condoms and/or dental dams every time you have vaginal, oral or anal sex?: No Do you douche?: No Date of last HIV test?: 08/04/21 Have you ever had an STD?: No Have any of your partners had an STD?: No Have you or your partner ever shot up drugs?: No Have any of your partners used drugs in the past?: No Have you or your partners exchanged money or drugs for sex?: No Risk Factors for Hep B Household, sexual, or needle sharing contact of a person infected with Hep B: No Sexual contact with a person who uses drugs not as prescribed?: No Currently or Ever used drugs not as prescribed: No HIV Positive: No PRep Patient: N/A Men who have sex with men: No Have  Hepatitis C: No History of Incarceration: No History of Homeslessness?: No Anal sex following anal drug use?: No Risk Factors for Hep C Currently using drugs not as prescribed: No Sexual partner(s) currently using drugs as not prescribed: No History of drug use: No HIV Positive: No People with a history of incarceration: No People born between the years of 33 and 71: No Counseling All Patients: Use  specific methods of contraceoptive and identify adverse effects (R), Stop tobacco use, implementing the 5A counseling approach (R), Encourage mammagram for women 28 or older and younger than 50 if conditions support (R), Emergency Contraception Offered (R) if unprotected sex in past 5 days and/or propyhlactically as indicated., Provide emergency contraception counseling (R), Typical use rates for method effectiveness (R), Delay future pregnancy from 18 months to 5 years (R) at [facility] visit, Appropriate referral for additional services as needed (R) Education: Make informed decision about family planning, Provided preconception counseling, Reduce risk of transmission and protection from STD's and HIV, Understand BMI >25 or >18.5 is a health risk (weight management educational materials to be provided to client requests), Review immunization history, inform client of recommended vaccines per CDC's ACIP Guidelines and refer to Immunization clinic, Promoted daily consumption of MVI with folic acid if capable of conceiving., Results of physical assessment and labs (if performed), How to discontinue the method selected and information on back up method used, How to use the method selected and information on back up method used, How to use the method consistently and correctly, Teach back method completed, Warning signs for rare but serious adverse events and what to do if they experience a warning sign (including emergency 24 hour number, where to seek emergency service outside of hours of operation), When to return for follow up (planned return schedule), PCP list given to patient Contraception Wrap Up Current Method: IUD or IUS End Method: IUD or IUS Contraception Counseling Provided: Yes How was the end contraceptive method provided?: Provided on site Interpreter Interpreters used: Randie Yemen  Diabetes screening This patient is 28 y.o. with a BMI of Body mass index is 24.27 kg/m.[name]  Is patient eligible for  diabetes screening (age >35 and BMI >25)?  no  Was Hgb A1c ordered? not applicable  STI screening Patient reports 1 of partners in last year.  Does this patient desire STI screening?  Yes  Hepatitis C screening Has patient been screened once for HCV in the past?  Yes  No results found for: HCVAB  Does the patient meet criteria for HCV testing? No  (If yes-- Screen for HCV through [facility]) Criteria:  Since the last HCV result, does the patient have any of the following? - Current drug use - Have a partner with drug use - Has been incarcerated  Hepatitis B screening Does the patient meet criteria for HBV testing? No Criteria:  -Household, sexual or needle sharing contact with HBV -History of drug use -HIV positive -Those with known Hep C  Cervical Cancer Screening  Result Date Procedure Results Follow-ups  01/26/2020 Pap IG (Image Guided) DIAGNOSIS:: Comment Specimen adequacy:: Comment Clinician Provided ICD10: Comment Performed by:: Comment PAP Smear Comment: . Note:: Comment Test Methodology: Comment     Health Maintenance Due  Topic Date Due   HPV VACCINES (1 - 3-dose series) Never done   Hepatitis B Vaccines (1 of 3 - 19+ 3-dose series) Never done   Cervical Cancer Screening (Pap smear)  01/26/2023   COVID-19 Vaccine (1 - 2024-25 season) Never done  The following portions of the patient's history were reviewed and updated as appropriate: allergies, current medications, past family history, past medical history, past social history, past surgical history and problem list. Problem list updated.  Objective:   Vitals:   12/03/23 1318  BP: 109/73  Pulse: 70  Weight: 137 lb (62.1 kg)  Height: 5' 3 (1.6 m)    Physical Exam Vitals and nursing note reviewed. Exam conducted with a chaperone present Emmie Yemen).  Constitutional:      Appearance: Normal appearance.  HENT:     Head: Normocephalic and atraumatic.     Mouth/Throat:     Mouth: Mucous  membranes are moist.     Pharynx: Oropharynx is clear. No oropharyngeal exudate or posterior oropharyngeal erythema.  Pulmonary:     Effort: Pulmonary effort is normal.  Abdominal:     General: Abdomen is flat.     Palpations: There is no mass.     Tenderness: There is no abdominal tenderness. There is no rebound.  Genitourinary:    General: Normal vulva.     Exam position: Lithotomy position.     Pubic Area: No rash or pubic lice.      Labia:        Right: No rash or lesion.        Left: No rash or lesion.      Vagina: Vaginal discharge present. No erythema, bleeding or lesions.     Cervix: No cervical motion tenderness, discharge, friability, lesion or erythema.     Uterus: Normal.      Adnexa: Right adnexa normal and left adnexa normal.     Rectum: Normal.     Comments: pH = 5  White IUD strings visualized from cervical os  Pink watery discharge present  Lymphadenopathy:     Head:     Right side of head: No preauricular or posterior auricular adenopathy.     Left side of head: No preauricular or posterior auricular adenopathy.     Cervical: No cervical adenopathy.     Upper Body:     Right upper body: No supraclavicular, axillary or epitrochlear adenopathy.     Left upper body: No supraclavicular, axillary or epitrochlear adenopathy.     Lower Body: No right inguinal adenopathy. No left inguinal adenopathy.   Skin:    General: Skin is warm and dry.     Findings: No rash.   Neurological:     Mental Status: She is alert and oriented to person, place, and time.     Assessment and Plan:  Lafreda Casebeer is a 28 y.o. female [phone] presenting to the [facility] for an yearly wellness and contraception visit   1. Family planning (Primary) Contraception counseling:  Reviewed options based on patient desire and reproductive life plan. Patient is interested in IUD or IUS. This was not provided to the patient today. IUD already in place.    Risks, benefits, and typical effectiveness rates were reviewed.  Questions were answered.  Written information was also given to the patient to review.    The patient will follow up in  1 years for surveillance.  The patient was told to call with any further questions, or with any concerns about this method of contraception.  Emphasized use of condoms 100% of the time for STI prevention.  Emergency Contraception Precautions (ECP): Patient assessed for need of ECP. She is not a candidate based on LARC in place and unexpired .  Educated on  ECP and reviewed options.  Patient desires no method - patient politely declines any emergency contraception.   2. Screening for venereal disease -declined blood work today  - Chlamydia/Gonorrhea [facility] - WET PREP FOR TRICH, YEAST, CLUE  3. Well woman exam with routine gynecological exam -update pap today  - IGP, rfx Aptima HPV ASCU  4. IUD (intrauterine device) in place -white IUD strings visualized- appears to be in appropriate place  5. Bacterial vaginosis  - metroNIDAZOLE (FLAGYL) 500 MG tablet; Take 1 tablet (500 mg total) by mouth 2 (two) times daily for 7 days.  6. Vaginal candidiasis  - clotrimazole  (GYNE-LOTRIMIN ) 1 % vaginal cream; Place 1 Applicatorful vaginally at bedtime for 7 days.    Return in about 1 year (around 12/02/2024) for annual well-woman exam.  No future appointments. Due to language barrier, a Spanish interpreter Emmie SAILOR.) was present in person during the history-taking, subsequent discussion, and physical exam with this patient.   Verneta Bers, [place]

## 2023-12-03 NOTE — Progress Notes (Signed)
 Pt is here for family planning visit.  Family planning education card reviewed and given to pt.  Wet prep results reviewed, treatment required per standing orders. The patient was dispensed metronidazole#14 and clotrimazole  vaginal #1 today. I provided counseling today regarding the medication. We discussed the medication, the side effects and when to call clinic. Patient given the opportunity to ask questions. Questions answered.  [facility] handout and pcp list given. Larraine JONELLE [place], RN

## 2023-12-05 ENCOUNTER — Ambulatory Visit: Payer: Self-pay | Admitting: Family Medicine

## 2023-12-05 LAB — IGP, RFX APTIMA HPV ASCU: PAP Smear Comment: 0

## 2024-02-08 ENCOUNTER — Ambulatory Visit

## 2024-02-15 ENCOUNTER — Ambulatory Visit

## 2024-03-03 ENCOUNTER — Ambulatory Visit

## 2024-03-03 VITALS — BP 109/73 | HR 69 | Ht 63.0 in | Wt 141.4 lb

## 2024-03-03 DIAGNOSIS — B3731 Acute candidiasis of vulva and vagina: Secondary | ICD-10-CM

## 2024-03-03 DIAGNOSIS — Z309 Encounter for contraceptive management, unspecified: Secondary | ICD-10-CM | POA: Diagnosis not present

## 2024-03-03 DIAGNOSIS — N898 Other specified noninflammatory disorders of vagina: Secondary | ICD-10-CM

## 2024-03-03 DIAGNOSIS — Z3009 Encounter for other general counseling and advice on contraception: Secondary | ICD-10-CM

## 2024-03-03 LAB — WET PREP FOR TRICH, YEAST, CLUE
Clue Cell Exam: NEGATIVE
Trichomonas Exam: NEGATIVE

## 2024-03-03 MED ORDER — FLUCONAZOLE 150 MG PO TABS: 150.0000 mg | ORAL_TABLET | Freq: Once | ORAL | 0 refills | Status: AC

## 2024-03-03 MED ORDER — FLUCONAZOLE 150 MG PO TABS: 150.0000 mg | ORAL_TABLET | Freq: Once | ORAL | 1 refills | Status: DC

## 2024-03-03 NOTE — Progress Notes (Signed)
 [facility] Problem Visit  [facility]  Subjective:  Robin Hawkins is a 28 y.o. being seen today for concern her IUD.  Chief Complaint  Patient presents with   Contraception   HPI: [name] reports her IUD strings have become longer since her last check. Her partner feels the IUD and is injured by it. Will have red spots on his penis after sex that go away quickly. IUD was placed in 2024. Issue has been since June 2025, before that it was not a problem.  Chase has no pain. She does have discharge, sometimes has a small amount of spotting after sex or randomly. No vaginal itching, burning, odor. Sometimes has burning sensation after sex. She does sometimes have pain during sex - burning. Normal g/c test in June. Was treated for yeast and BV in June, had similar sx. Went away with the treatment, but returned shortly afterward. Uses Vagisil feminine hygiene soap. Discussed use of unscented Dove soap and only on external portion of her vulva.   Health Maintenance Due  Topic Date Due   HPV VACCINES (1 - 3-dose series) Never done   Hepatitis B Vaccines 19-59 Average Risk (1 of 3 - 19+ 3-dose series) Never done   Influenza Vaccine  01/04/2024   COVID-19 Vaccine (1 - 2024-25 season) Never done   Review of Systems  All other systems reviewed and are negative.  The following portions of the patient's history were reviewed and updated as appropriate: allergies, current medications, past family history, past medical history, past social history, past surgical history and problem list. Problem list updated.  See flowsheet for other program required questions.  Objective:   Vitals:   03/03/24 0917  BP: 109/73  Pulse: 69  Weight: 141 lb 6.4 oz (64.1 kg)  Height: 5' 3 (1.6 m)   Physical Exam Vitals and nursing note reviewed. Exam conducted with a chaperone present (Alexia Williams).  Constitutional:      Appearance: Normal appearance.  HENT:      Head: Normocephalic and atraumatic.     Mouth/Throat:     Mouth: Mucous membranes are moist.     Pharynx: Oropharynx is clear. No oropharyngeal exudate or posterior oropharyngeal erythema.  Pulmonary:     Effort: Pulmonary effort is normal.  Abdominal:     General: Abdomen is flat.     Palpations: There is no mass.     Tenderness: There is no abdominal tenderness. There is no rebound.  Genitourinary:    General: Normal vulva.     Exam position: Lithotomy position.     Pubic Area: No rash or pubic lice.      Labia:        Right: No rash or lesion.        Left: No rash or lesion.      Vagina: Vaginal discharge present. No erythema, bleeding or lesions.     Cervix: No cervical motion tenderness, discharge, friability, lesion or erythema.     Uterus: Normal.      Adnexa: Right adnexa normal and left adnexa normal.     Rectum: Normal.        Comments: pH = <4.5 IUD strings visible and tucked around cervix White, clumpy discharge Lymphadenopathy:     Head:     Right side of head: No preauricular or posterior auricular adenopathy.     Left side of head: No preauricular or posterior auricular adenopathy.     Cervical: No cervical adenopathy.  Upper Body:     Right upper body: No supraclavicular, axillary or epitrochlear adenopathy.     Left upper body: No supraclavicular, axillary or epitrochlear adenopathy.     Lower Body: No right inguinal adenopathy. No left inguinal adenopathy.  Skin:    General: Skin is warm and dry.     Findings: No rash.  Neurological:     Mental Status: She is alert and oriented to person, place, and time.    Assessment and Plan:  Robin Hawkins is a 28 y.o. female presenting to the [facility] Department for a Women's Health problem visit  1. Family planning (Primary) - IUD in place, prefers to maintain but partner bothered by IUD. - IUD in correct location, with strings tucked around cervix and not protruding. Discussed that as  IUD is in correct place with correct string length, that there is nothing to adjust or fix.  - Patient wants to keep IUD  2. Vaginal discharge  - WET PREP FOR TRICH, YEAST, CLUE  3. Vulvovaginal candidiasis  - Treated for yeast infection in June with clotrimazole , symptoms resolved but returned - Will treat w/ fluconazole, can take one dose today and another in 72 hours if persistent symptoms - fluconazole (DIFLUCAN) 150 MG tablet; Take 1 tablet (150 mg total) by mouth once for 1 dose.  Dispense: 2 tablet; Refill: 0   Return if symptoms worsen or fail to improve.  No future appointments.  Damien FORBES Satchel, NP

## 2024-03-03 NOTE — Progress Notes (Signed)
 Wet prep results reviewed with pt, notified pt that medication will be sent to pharmacy and provided yeast infection (spanish) handout to pt. Larraine JONELLE Northern, RN

## 2024-03-04 IMAGING — US US MFM UA CORD DOPPLER
1 series · 14 of 28 positions shown · non-contrast
Comparison: none

[Series 1: us mfm ua cord doppler · 32 acquisitions, 14 frames shown]
[im 2/32]
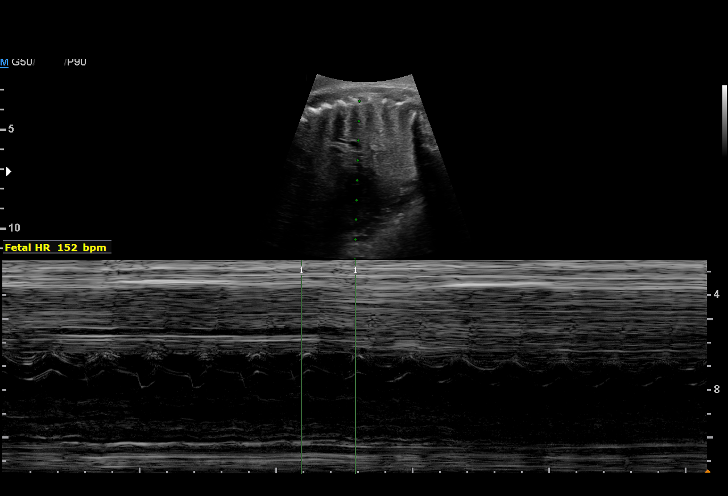
[im 4/32]
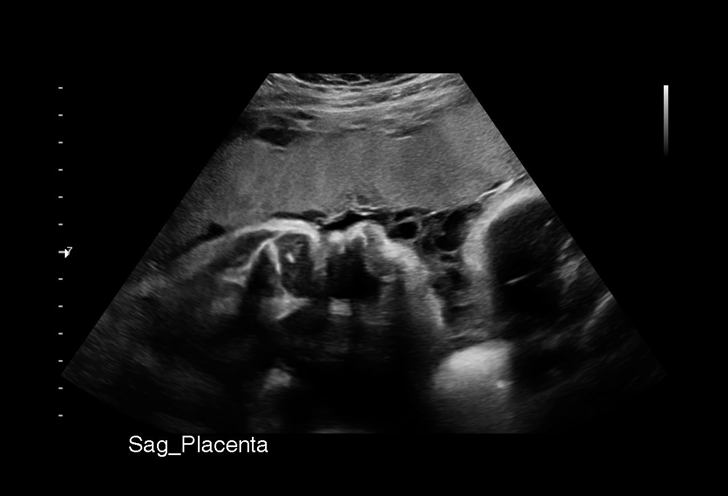
[im 6/32]
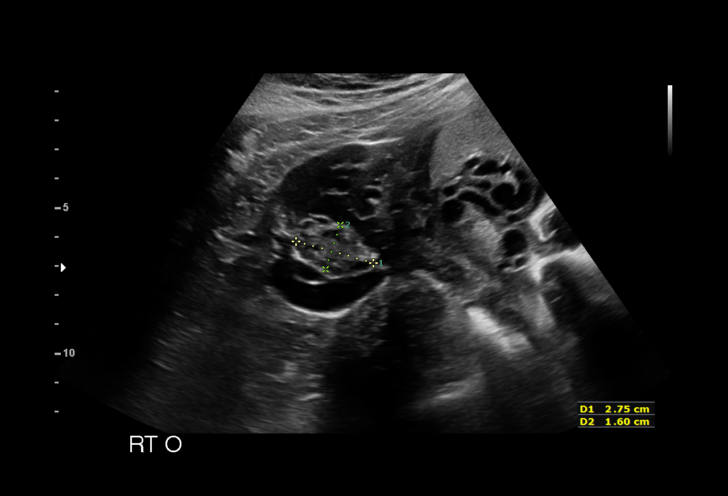
[im 9/32]
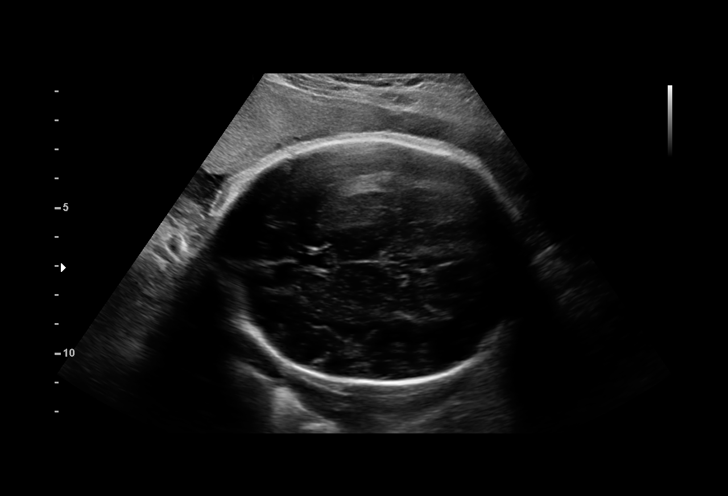
[im 11/32]
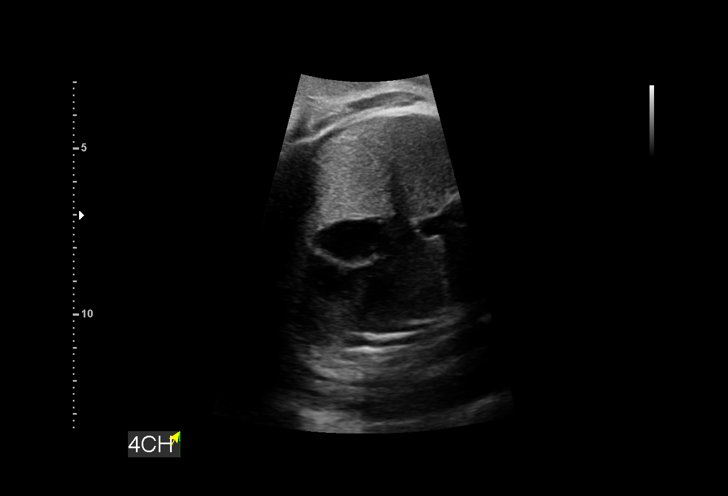
[im 13/32]
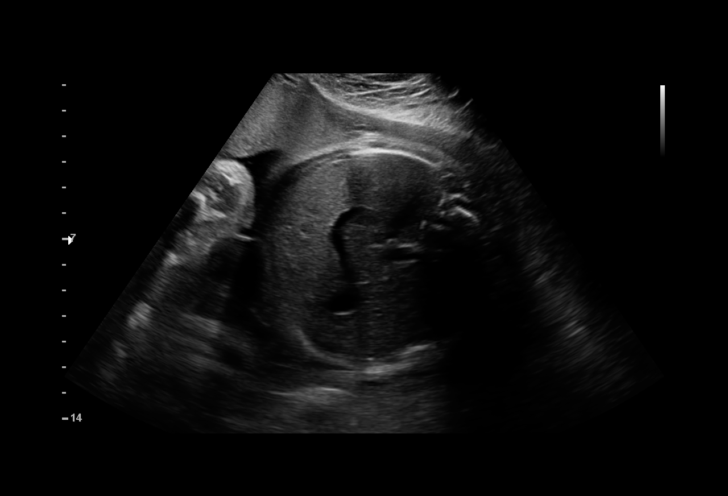
[im 15/32]
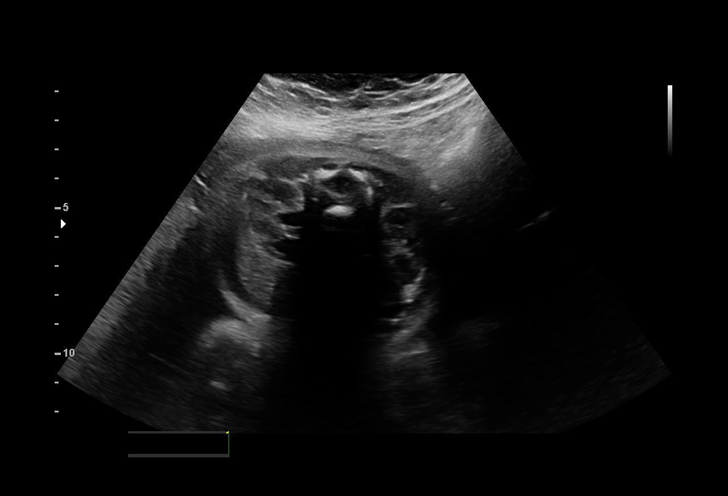
[im 18/32]
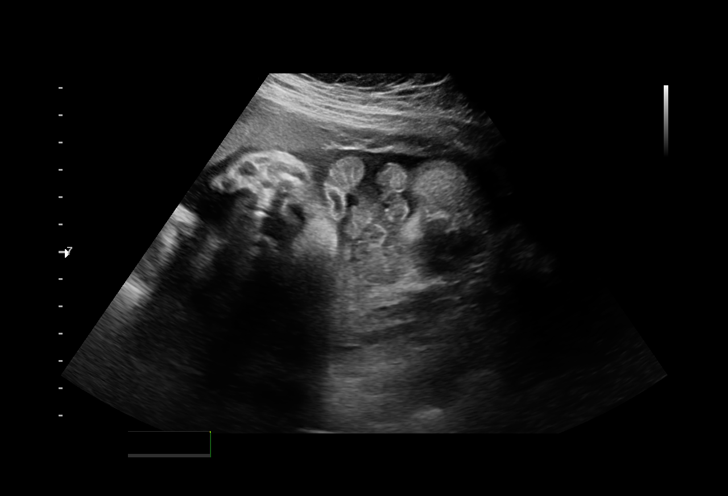
[im 20/32]
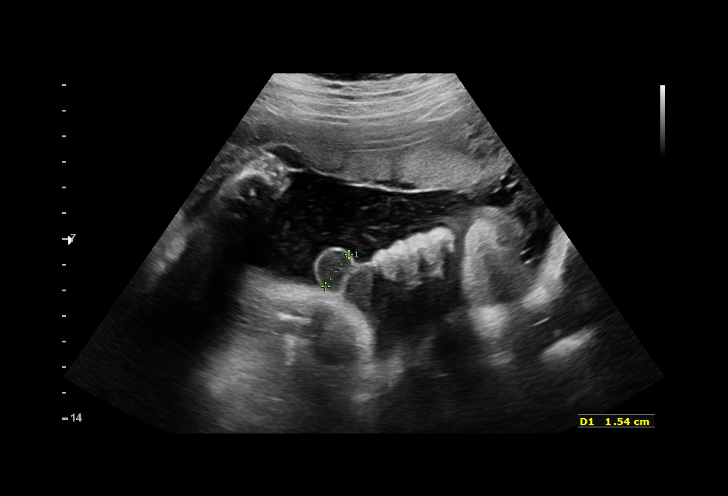
[im 22/32]
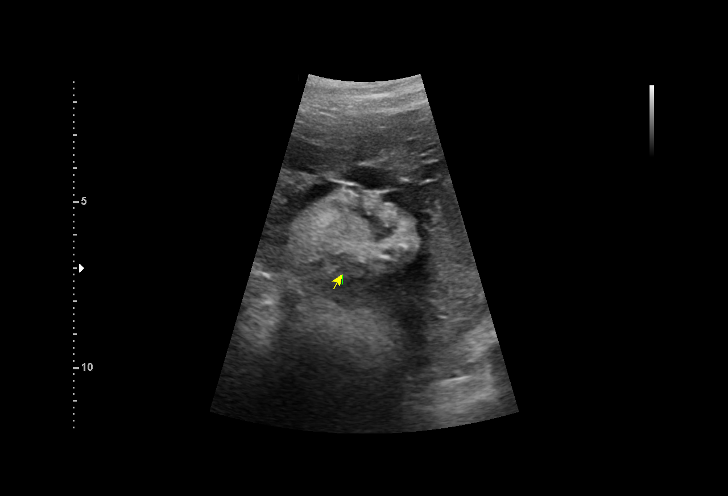
[im 25/32]
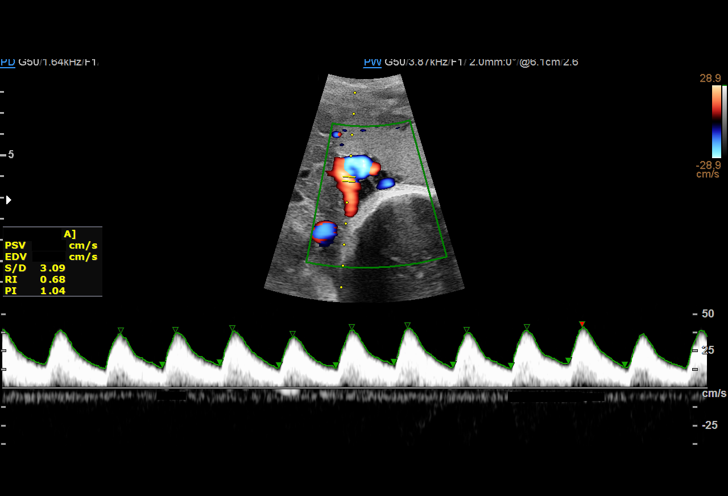
[im 27/32]
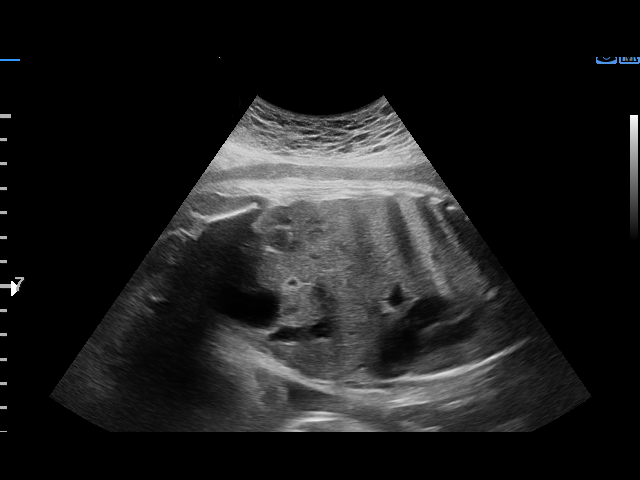
[im 29/32]
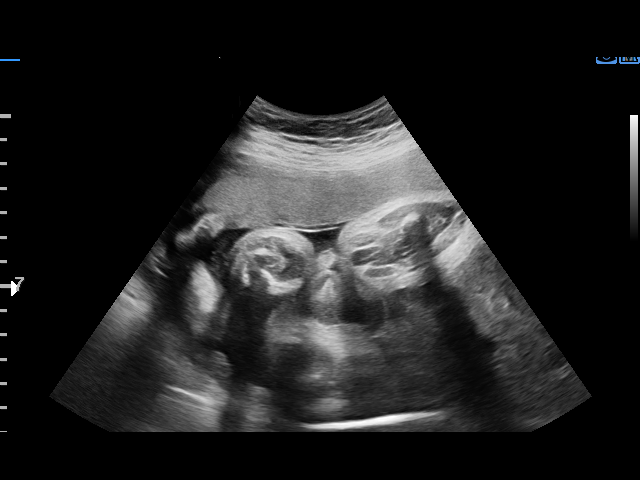
[im 32/32]
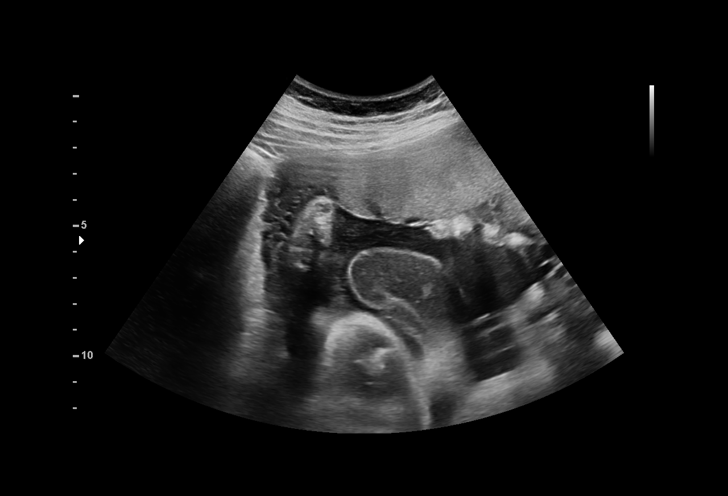

[14 of 28 positions shown; findings below may reference images not displayed]

Attending:        Wk Galanza      Secondary Phy.:   [HOSPITAL]
                                                            [facility]
                                                            Department
                                                            [address]
                   FNP                                      [HOSPITAL] at
 Ref. Address:     [address]
                   [address]
                   [address]

Indications

 Maternal care for known or suspected poor
 fetal growth, third trimester, not applicable or
 unspecified IUGR
 Gastroschisis
 Abnormal biochemical screen (Abnormal
 AFP 7.51)
 35 weeks gestation of pregnancy
Fetal Evaluation

 Num Of Fetuses:         1
 Fetal Heart Rate(bpm):  152
 Cardiac Activity:       Observed
 Presentation:           Cephalic
 Placenta:               Anterior
 P. Cord Insertion:      Previously Visualized
 Amniotic Fluid
 AFI FV:      Within normal limits

 AFI Sum(cm)     %Tile       Largest Pocket(cm)
 10.92           28

 RUQ(cm)       RLQ(cm)       LUQ(cm)        LLQ(cm)

Biophysical Evaluation

 Amniotic F.V:   Within normal limits       F. Tone:        Observed
 F. Movement:    Observed                   Score:          [DATE]
 F. Breathing:   Observed
Biometry

 LV:        3.5  mm
OB History

 Gravidity:    3         Term:   1        Prem:   0        SAB:   1
 TOP:          0       Ectopic:  0        Living: 1
Gestational Age

 LMP:           35w 4d        Date:  12/04/20                 EDD:   09/10/21
 Best:          35w 4d     Det. By:  LMP  (12/04/20)          EDD:   09/10/21
Anatomy

 Ventricles:            Appears normal         Abdominal Wall:         Gastroschisis
 Heart:                 Appears normal         Kidneys:                Appear normal
                        (4CH, axis, and
                        situs)
 Stomach:               Appears normal, left   Bladder:                Appears normal
                        sided
Doppler - Fetal Vessels

 Umbilical Artery
  S/D     %tile      RI    %tile      PI    %tile            ADFV    RDFV
   3.2       88    0.69       91     1.1       91               No      No

Cervix Uterus Adnexa

 Cervix
 Not visualized (advanced GA >31wks)

 Right Ovary
 Visualized.

 Left Ovary
 Visualized.
Impression

 Antenatal testing performed given fetal gastroschisis
 The biophysical profile was [DATE] with good fetal movement and
 amniotic fluid volume.
 The UA Dopplers are normal without evidence of AEDF or
 REDF.

 The bowel is again mildy dilated as seen in previous studies
 of 1.5 cm.
 All questions answered.
Recommendations

 Repeat testing with UA dopplers and growth in 1 week.
 Planned delivery at 37 weeks at [HOSPITAL]

## 2024-03-21 ENCOUNTER — Telehealth: Payer: Self-pay | Admitting: Family Medicine

## 2024-03-21 NOTE — Telephone Encounter (Signed)
 LVM for pt to call back.

## 2024-03-26 ENCOUNTER — Telehealth: Payer: Self-pay

## 2024-03-28 ENCOUNTER — Ambulatory Visit

## 2024-04-04 ENCOUNTER — Ambulatory Visit

## 2024-05-07 ENCOUNTER — Ambulatory Visit

## 2024-05-07 VITALS — BP 104/69 | HR 77 | Wt 139.0 lb

## 2024-05-07 DIAGNOSIS — B3731 Acute candidiasis of vulva and vagina: Secondary | ICD-10-CM

## 2024-05-07 DIAGNOSIS — Z309 Encounter for contraceptive management, unspecified: Secondary | ICD-10-CM | POA: Diagnosis not present

## 2024-05-07 DIAGNOSIS — N898 Other specified noninflammatory disorders of vagina: Secondary | ICD-10-CM

## 2024-05-07 LAB — WET PREP FOR TRICH, YEAST, CLUE
Clue Cell Exam: NEGATIVE
Trichomonas Exam: NEGATIVE

## 2024-05-07 MED ORDER — CLOTRIMAZOLE 1 % VA CREA: 1.0000 | TOPICAL_CREAM | Freq: Every day | VAGINAL | Status: AC

## 2024-05-07 MED ORDER — FLUCONAZOLE 150 MG PO TABS: 150.0000 mg | ORAL_TABLET | Freq: Once | ORAL | Status: DC

## 2024-05-07 NOTE — Progress Notes (Signed)
 Pt is here for an acute visit. Wet prep results reviewed with patient and was dispensed clotrimazole  1% vaginal cream to be applied before bed for 7 and pt has been advised that Diflucan  150 mg tablet has been sent to the pharmacy for pick up.condoms declined. Kwadwo Antonios Ostrow,RN.

## 2024-05-07 NOTE — Progress Notes (Cosign Needed)
 [facility] [address] [place] [address] Main phone: [phone]  Women's Health Problem Visit   Subjective:  Robin Hawkins is a 28 y.o. being seen today for white discharge without itching, burning.   Chief Complaint  Patient presents with   Acute Visit   HPI: Treated for a yeast infection on 03/03/24 with fluconazole  x2 but did not have any improvement with the medication. Changed soap to see if that would help to a non-scented soap. She was also treatment in June, 2025 for yeast with clotrimazole .   Health Maintenance Due  Topic Date Due   Hepatitis B Vaccines 19-59 Average Risk (1 of 3 - 19+ 3-dose series) Never done   Influenza Vaccine  01/04/2024   COVID-19 Vaccine (1 - 2025-26 season) Never done   HPV VACCINES (1 - 3-dose SCDM series) 03/15/2024   Review of Systems  All other systems reviewed and are negative.  The following portions of the patient's history were reviewed and updated as appropriate: allergies, current medications, past family history, past medical history, past social history, past surgical history and problem list. Problem list updated.  See flowsheet for other program required questions.  Objective:   Vitals:   05/07/24 0919  BP: 104/69  Pulse: 77  Weight: 139 lb (63 kg)   Physical Exam Constitutional:      Appearance: Normal appearance. She is normal weight.  HENT:     Head: Normocephalic.     Mouth/Throat:     Mouth: Mucous membranes are moist.  Eyes:     General: No scleral icterus.       Right eye: No discharge.        Left eye: No discharge.  Pulmonary:     Effort: Pulmonary effort is normal.  Abdominal:     General: Abdomen is flat.  Genitourinary:    Comments: Declined genital exam and desired to self swab Lymphadenopathy:     Cervical: No cervical adenopathy.     Right cervical: No superficial cervical adenopathy.    Left cervical: No  superficial cervical adenopathy.  Skin:    General: Skin is warm and dry.     Findings: No rash.  Neurological:     General: No focal deficit present.     Mental Status: She is alert.  Psychiatric:        Mood and Affect: Mood normal.        Behavior: Behavior normal.    Assessment and Plan:  Robin Hawkins is a 28 y.o. female presenting to the [facility] Department for a Women's Health problem visit  1. Vaginal discharge (Primary)  - WET PREP FOR TRICH, YEAST, CLUE  2. Vulvovaginal candidiasis  - Recurrent yeast: third yeast infection since June: discussed that if this treatment fails and she continues to have symptoms she will need a yeast culture. Advised her to schedule appointment with [facility] if yeast sx persist for evaluation. - Will treat with both clotrimazole  and fluconazole  for recurrent yeast - clotrimazole  (GYNE-LOTRIMIN ) 1 % vaginal cream; Place 1 Applicatorful vaginally at bedtime for 7 days. - fluconazole  (DIFLUCAN ) 150 MG tablet; Take 1 tablet (150 mg total) by mouth once for 1 dose. 1 refill. Take one today and one in 72 hours if symptoms persist. Sent to her pharmacy by Verneta Bers NP as I am not yet in [facility] system yet for prescriptions.  Return if symptoms worsen or fail to  improve.  No future appointments.  Damien FORBES Satchel, NP

## 2024-05-09 ENCOUNTER — Other Ambulatory Visit: Payer: Self-pay | Admitting: Family Medicine

## 2024-05-09 DIAGNOSIS — B379 Candidiasis, unspecified: Secondary | ICD-10-CM

## 2024-05-09 MED ORDER — FLUCONAZOLE 150 MG PO TABS: 150.0000 mg | ORAL_TABLET | Freq: Once | ORAL | 0 refills | Status: AC

## 2024-06-10 ENCOUNTER — Telehealth: Payer: Self-pay | Admitting: Family Medicine

## 2024-06-10 NOTE — Telephone Encounter (Signed)
 Patient states the her prescription for the yeast infection didn't work. She was told to call us .
